# Patient Record
Sex: Male | Born: 1985 | Race: Black or African American | Hispanic: No | Marital: Single | Smoking: Current every day smoker
Health system: Southern US, Community
[De-identification: ages and names within clinical notes are randomized; demographics above are authoritative.]

## PROBLEM LIST (undated history)

## (undated) DIAGNOSIS — R569 Unspecified convulsions: Secondary | ICD-10-CM

## (undated) DIAGNOSIS — T8092XA Unspecified transfusion reaction, initial encounter: Secondary | ICD-10-CM

## (undated) DIAGNOSIS — K5190 Ulcerative colitis, unspecified, without complications: Secondary | ICD-10-CM

## (undated) DIAGNOSIS — D689 Coagulation defect, unspecified: Secondary | ICD-10-CM

## (undated) DIAGNOSIS — F32A Depression, unspecified: Secondary | ICD-10-CM

## (undated) DIAGNOSIS — G8929 Other chronic pain: Secondary | ICD-10-CM

## (undated) DIAGNOSIS — I639 Cerebral infarction, unspecified: Secondary | ICD-10-CM

## (undated) DIAGNOSIS — K91850 Pouchitis: Secondary | ICD-10-CM

## (undated) DIAGNOSIS — Z5189 Encounter for other specified aftercare: Secondary | ICD-10-CM

## (undated) HISTORY — DX: Unspecified transfusion reaction, initial encounter: T80.92XA

## (undated) HISTORY — PX: TOTAL COLECTOMY: SHX852

## (undated) HISTORY — DX: Encounter for other specified aftercare: Z51.89

## (undated) HISTORY — PX: SHOULDER SURGERY: SHX246

## (undated) HISTORY — DX: Depression, unspecified: F32.A

## (undated) HISTORY — DX: Coagulation defect, unspecified: D68.9

## (undated) HISTORY — DX: Other chronic pain: G89.29

## (undated) HISTORY — DX: Pouchitis: K91.850

---

## 2011-09-10 DIAGNOSIS — F17200 Nicotine dependence, unspecified, uncomplicated: Secondary | ICD-10-CM | POA: Insufficient documentation

## 2011-09-10 DIAGNOSIS — Z9049 Acquired absence of other specified parts of digestive tract: Secondary | ICD-10-CM | POA: Insufficient documentation

## 2013-06-21 DIAGNOSIS — Z8719 Personal history of other diseases of the digestive system: Secondary | ICD-10-CM | POA: Insufficient documentation

## 2013-06-21 DIAGNOSIS — Z8673 Personal history of transient ischemic attack (TIA), and cerebral infarction without residual deficits: Secondary | ICD-10-CM | POA: Insufficient documentation

## 2015-05-04 DIAGNOSIS — F1020 Alcohol dependence, uncomplicated: Secondary | ICD-10-CM | POA: Insufficient documentation

## 2020-06-07 DIAGNOSIS — F3340 Major depressive disorder, recurrent, in remission, unspecified: Secondary | ICD-10-CM | POA: Insufficient documentation

## 2020-06-07 DIAGNOSIS — K5190 Ulcerative colitis, unspecified, without complications: Secondary | ICD-10-CM | POA: Insufficient documentation

## 2020-06-07 DIAGNOSIS — L0591 Pilonidal cyst without abscess: Secondary | ICD-10-CM | POA: Insufficient documentation

## 2020-08-20 ENCOUNTER — Encounter (HOSPITAL_COMMUNITY): Payer: Self-pay

## 2020-08-20 ENCOUNTER — Ambulatory Visit (INDEPENDENT_AMBULATORY_CARE_PROVIDER_SITE_OTHER): Payer: Self-pay

## 2020-08-20 ENCOUNTER — Other Ambulatory Visit: Payer: Self-pay

## 2020-08-20 ENCOUNTER — Ambulatory Visit (HOSPITAL_COMMUNITY)
Admission: EM | Admit: 2020-08-20 | Discharge: 2020-08-20 | Disposition: A | Discharge status: Home / Self Care | Payer: Self-pay

## 2020-08-20 DIAGNOSIS — R197 Diarrhea, unspecified: Secondary | ICD-10-CM | POA: Insufficient documentation

## 2020-08-20 DIAGNOSIS — R109 Unspecified abdominal pain: Secondary | ICD-10-CM | POA: Insufficient documentation

## 2020-08-20 DIAGNOSIS — K921 Melena: Secondary | ICD-10-CM | POA: Insufficient documentation

## 2020-08-20 HISTORY — DX: Cerebral infarction, unspecified: I63.9

## 2020-08-20 HISTORY — DX: Ulcerative colitis, unspecified, without complications: K51.90

## 2020-08-20 HISTORY — DX: Unspecified convulsions: R56.9

## 2020-08-20 LAB — CBC WITH DIFFERENTIAL/PLATELET
Abs Immature Granulocytes: 0 10*3/uL (ref 0.00–0.07)
Basophils Absolute: 0 10*3/uL (ref 0.0–0.1)
Basophils Relative: 0 %
Eosinophils Absolute: 0.1 10*3/uL (ref 0.0–0.5)
Eosinophils Relative: 3 %
HCT: 43.7 % (ref 39.0–52.0)
Hemoglobin: 15.3 g/dL (ref 13.0–17.0)
Immature Granulocytes: 0 %
Lymphocytes Relative: 40 %
Lymphs Abs: 1.3 10*3/uL (ref 0.7–4.0)
MCH: 31.1 pg (ref 26.0–34.0)
MCHC: 35 g/dL (ref 30.0–36.0)
MCV: 88.8 fL (ref 80.0–100.0)
Monocytes Absolute: 0.2 10*3/uL (ref 0.1–1.0)
Monocytes Relative: 8 %
Neutro Abs: 1.6 10*3/uL — ABNORMAL LOW (ref 1.7–7.7)
Neutrophils Relative %: 49 %
Platelets: 241 10*3/uL (ref 150–400)
RBC: 4.92 MIL/uL (ref 4.22–5.81)
RDW: 12.2 % (ref 11.5–15.5)
WBC: 3.2 10*3/uL — ABNORMAL LOW (ref 4.0–10.5)
nRBC: 0 % (ref 0.0–0.2)

## 2020-08-20 LAB — COMPREHENSIVE METABOLIC PANEL
ALT: 27 U/L (ref 0–44)
AST: 31 U/L (ref 15–41)
Albumin: 4.5 g/dL (ref 3.5–5.0)
Alkaline Phosphatase: 83 U/L (ref 38–126)
Anion gap: 11 (ref 5–15)
BUN: 10 mg/dL (ref 6–20)
CO2: 25 mmol/L (ref 22–32)
Calcium: 9.6 mg/dL (ref 8.9–10.3)
Chloride: 102 mmol/L (ref 98–111)
Creatinine, Ser: 0.8 mg/dL (ref 0.61–1.24)
GFR, Estimated: >60 mL/min (ref >60)
Glucose, Bld: 94 mg/dL (ref 70–99)
Potassium: 4.2 mmol/L (ref 3.5–5.1)
Sodium: 138 mmol/L (ref 135–145)
Total Bilirubin: 0.5 mg/dL (ref 0.3–1.2)
Total Protein: 8.1 g/dL (ref 6.5–8.1)

## 2020-08-20 LAB — TSH: TSH: 1.738 u[IU]/mL (ref 0.350–4.500)

## 2020-08-20 NOTE — ED Triage Notes (Addendum)
Pt in with c/o N/V/D, loss of appetite and abdominal pain that has been going on for a few years. States that he has Ulcerative colitis and thinks he has developed Chron's. States that his sxs have gradually been getting worse   Also c/o night sweats and 25 lb weight loss within the last 3 months  Pt has been taking imodium prn

## 2020-08-20 NOTE — Discharge Instructions (Signed)
Your white cell count is a little low, and high means infection You dont have anemia The abdominal xray is normal The chemistry test is not back yet, but we can inform you it is abnormal. Sign up for mychart to see your results.  Make sure you call for GI follow up ASAP

## 2020-08-20 NOTE — ED Provider Notes (Signed)
[facility]    CSN: [id number] Arrival date & time: 08/20/20  1305      History   Chief Complaint Chief Complaint  Patient presents with  . Nausea  . Vomiting       . Diarrhea  . Abdominal Cramping    HPI Mark Waters is a 34 y.o. male who presents due to having N/V/D, loss of appetite and chronic abdominal pain since 2013. Has hx of ulcerative colitis since age 28 y/o and thinks he has developed chron's. His symptoms have been gradually getting worse where he has 8-9 BM's/day with spotting. Has lost about 25 lbs in the past 3 months.  Wakes up with nausea and gets worse in am and vomits. Has night sweats He Thinks he has an anal fissure Has cold intolerance or hot intolerance with sweating  Has swelling of anal area and has used preparation H, and now has a duvey which helps.    Past Medical History:  Diagnosis Date  . Seizures (HCC)   . Stroke (HCC)   . Ulcerative colitis (HCC)     There are no problems to display for this patient.   History reviewed. No pertinent surgical history.     Home Medications    Prior to Admission medications   Medication Sig Start Date End Date Taking? Authorizing Provider  escitalopram (LEXAPRO) 10 MG tablet Take by mouth. 04/10/20  Yes [provider]  levETIRAcetam (KEPPRA) 100 MG/ML SOLN Take by mouth.    [provider]    Family History History reviewed. No pertinent family history.  Social History Social History   Tobacco Use  . Smoking status: Former Games developer  . Smokeless tobacco: Never Used  Vaping Use  . Vaping Use: Every day  Substance Use Topics  . Alcohol use: Yes  . Drug use: Yes    Types: Marijuana     Allergies   Patient has no known allergies.   Review of Systems Review of Systems Wakes up with nausea and gets worse in am and vomits. Has night sweats Has anal fissure Has cold intolerance or hot intolerance with sweating  Has swelling of anal area. + for blood in  stool, + wt loss Physical Exam Triage Vital Signs ED Triage Vitals  Enc Vitals Group     BP 08/20/20 1452 (!) 142/98     Pulse Rate 08/20/20 1449 66     Resp 08/20/20 1449 20     Temp 08/20/20 1449 97.9 F (36.6 C)     Temp Source 08/20/20 1449 Oral     SpO2 08/20/20 1449 100 %     Weight --      Height --      Head Circumference --      Peak Flow --      Pain Score 08/20/20 1451 3     Pain Loc --      Pain Edu? --      Excl. in GC? --    No data found.  Updated Vital Signs BP (!) 142/98   Pulse 66   Temp 97.9 F (36.6 C) (Oral)   Resp 20   SpO2 100%   Visual Acuity Right Eye Distance:   Left Eye Distance:   Bilateral Distance:    Right Eye Near:   Left Eye Near:    Bilateral Near:     Physical Exam Constitutional:      General: He is not in acute distress.    Appearance: He  is not toxic-appearing.     Comments: Is slim  HENT:     Head: Normocephalic.     Right Ear: External ear normal.     Left Ear: External ear normal.  Eyes:     General: No scleral icterus.    Conjunctiva/sclera: Conjunctivae normal.  Cardiovascular:     Rate and Rhythm: Normal rate and regular rhythm.     Heart sounds: No murmur heard.   Pulmonary:     Effort: Pulmonary effort is normal.     Breath sounds: Normal breath sounds.  Abdominal:     General: Abdomen is flat. Bowel sounds are normal. There is no distension.     Palpations: Abdomen is soft. There is no mass.     Tenderness: There is abdominal tenderness. There is no guarding or rebound.     Hernia: No hernia is present.     Comments: Has well healed scar on lower abd, and mild tenderness on LLQ  Musculoskeletal:        General: Normal range of motion.     Cervical back: Neck supple.  Skin:    General: Skin is warm and dry.     Findings: No rash.  Neurological:     Mental Status: He is alert and oriented to person, place, and time.     Gait: Gait normal.  Psychiatric:        Mood and Affect: Mood normal.         Behavior: Behavior normal.        Thought Content: Thought content normal.        Judgment: Judgment normal.      UC Treatments / Results  Labs (all labs ordered are listed, but only abnormal results are displayed) Labs Reviewed  CBC WITH DIFFERENTIAL/PLATELET - Abnormal; Notable for the following components:      Result Value   WBC 3.2 (*)    Neutro Abs 1.6 (*)    All other components within normal limits  COMPREHENSIVE METABOLIC PANEL  TSH    EKG   Radiology DG Abd 2 Views  Result Date: 08/20/2020 CLINICAL DATA:  34 year old male with ulcerative colitis, prior colon resection. Bloody diarrhea, abdominal pain. EXAM: ABDOMEN - 2 VIEW COMPARISON:  None. FINDINGS: Upright and supine views. Negative visible lungs. No pneumoperitoneum. Paucity of bowel gas in the abdomen with no dilated loops evident. Relatively normal appearing pelvic, rectal bowel gas. Normal gastric air. Other abdominal and pelvic visceral contours appear normal. Minimal levoconvex lumbar scoliosis. No acute osseous abnormality identified. IMPRESSION: Negative. Electronically Signed   By: Odessa Fleming M.D.   On: 08/20/2020 16:05    Procedures Procedures (including critical care time)  Medications Ordered in UC Medications - No data to display  Initial Impression / Assessment and Plan / UC Course  I have reviewed the triage vital signs and the nursing notes. Recurrent and worsening abd pain and diarrhea.  CBC does not show infection or anemia, KUB is neg.  Pertinent labs & imaging results that were available during my care of the patient were reviewed by me and considered in my medical decision making (see chart for details). CMP was still pending when he left. We will inform him if abnormal. Needs to FU with GI. I educated him about SIBO and while awaiting for test results he read about it, he has heard about Leaky Gut and believes was tested for this in the past.  If he gets worse pain, bleeding then he needs  to go to ER  Final Clinical Impressions(s) / UC Diagnoses   Final diagnoses:  Abdominal cramping  Diarrhea, unspecified type  Blood in stool     Discharge Instructions     Your white cell count is a little low, and high means infection You dont have anemia The abdominal xray is normal The chemistry test is not back yet, but we can inform you it is abnormal. Sign up for mychart to see your results.  Make sure you call for GI follow up ASAP    ED Prescriptions    None     PDMP not reviewed this encounter.   Garey Ham, PA-C 08/20/20 1650

## 2020-10-03 ENCOUNTER — Ambulatory Visit: Payer: Self-pay | Admitting: Gastroenterology

## 2020-11-28 ENCOUNTER — Other Ambulatory Visit: Payer: Self-pay

## 2020-11-28 ENCOUNTER — Ambulatory Visit: Payer: Self-pay

## 2020-11-28 ENCOUNTER — Ambulatory Visit (INDEPENDENT_AMBULATORY_CARE_PROVIDER_SITE_OTHER): Payer: 59 | Admitting: Family Medicine

## 2020-11-28 VITALS — BP 150/108 | HR 97 | Ht 75.0 in | Wt 195.6 lb

## 2020-11-28 DIAGNOSIS — G40909 Epilepsy, unspecified, not intractable, without status epilepticus: Secondary | ICD-10-CM | POA: Insufficient documentation

## 2020-11-28 DIAGNOSIS — M25512 Pain in left shoulder: Secondary | ICD-10-CM

## 2020-11-28 DIAGNOSIS — G8929 Other chronic pain: Secondary | ICD-10-CM | POA: Diagnosis not present

## 2020-11-28 DIAGNOSIS — M542 Cervicalgia: Secondary | ICD-10-CM | POA: Diagnosis not present

## 2020-11-28 DIAGNOSIS — I639 Cerebral infarction, unspecified: Secondary | ICD-10-CM | POA: Insufficient documentation

## 2020-11-28 NOTE — Patient Instructions (Addendum)
Thank you for coming in today.  Let try PT again.  Recheck in 4 weeks.   Let me know how its going.
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
## 2020-12-15 ENCOUNTER — Emergency Department (HOSPITAL_COMMUNITY)
Admission: EM | Admit: 2020-12-15 | Discharge: 2020-12-16 | Disposition: A | Discharge status: Home / Self Care | Payer: 59 | Attending: Emergency Medicine | Admitting: Emergency Medicine

## 2020-12-15 ENCOUNTER — Other Ambulatory Visit: Payer: Self-pay

## 2020-12-15 ENCOUNTER — Encounter (HOSPITAL_COMMUNITY): Payer: Self-pay

## 2020-12-15 ENCOUNTER — Emergency Department (HOSPITAL_COMMUNITY): Payer: 59

## 2020-12-15 DIAGNOSIS — Z87891 Personal history of nicotine dependence: Secondary | ICD-10-CM | POA: Insufficient documentation

## 2020-12-15 DIAGNOSIS — Y92410 Unspecified street and highway as the place of occurrence of the external cause: Secondary | ICD-10-CM | POA: Insufficient documentation

## 2020-12-15 DIAGNOSIS — M542 Cervicalgia: Secondary | ICD-10-CM | POA: Insufficient documentation

## 2020-12-15 DIAGNOSIS — M79642 Pain in left hand: Secondary | ICD-10-CM | POA: Insufficient documentation

## 2020-12-15 DIAGNOSIS — R519 Headache, unspecified: Secondary | ICD-10-CM | POA: Insufficient documentation

## 2020-12-15 DIAGNOSIS — S60222A Contusion of left hand, initial encounter: Secondary | ICD-10-CM

## 2020-12-15 DIAGNOSIS — S40012A Contusion of left shoulder, initial encounter: Secondary | ICD-10-CM

## 2020-12-15 DIAGNOSIS — M25512 Pain in left shoulder: Secondary | ICD-10-CM | POA: Insufficient documentation

## 2020-12-15 MED ORDER — MORPHINE SULFATE (PF) 4 MG/ML IV SOLN
4.0000 mg | Freq: Once | INTRAVENOUS | Status: AC
  Administered 2020-12-16: 4 mg via INTRAVENOUS
  Filled 2020-12-15: qty 1

## 2020-12-15 MED ORDER — HYDROCODONE-ACETAMINOPHEN 5-325 MG PO TABS: 1.0000 | ORAL_TABLET | Freq: Once | ORAL | Status: DC
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
## 2020-12-15 NOTE — ED Notes (Signed)
Pt asking for something for pain advised would need to speak with provider reference same. Provider sent a message

## 2020-12-15 NOTE — ED Notes (Signed)
Now asking if pt can get up and walk to the bathroom to have a bm. Spoke with provider and was advised that yes he could

## 2020-12-15 NOTE — ED Notes (Signed)
Provider at bedside at this time

## 2020-12-16 ENCOUNTER — Emergency Department (HOSPITAL_COMMUNITY): Payer: 59

## 2020-12-16 NOTE — ED Notes (Signed)
Provider at bedside at this time

## 2020-12-16 NOTE — ED Notes (Signed)
Pt requesting something to drink advised would need to speak with provider. Provider sent a message at this time

## 2020-12-16 NOTE — ED Notes (Signed)
Remains in the bathroom at this time

## 2020-12-16 NOTE — Discharge Instructions (Signed)
Apply ice for 30 minutes at a time, 4 times a day.  Take acetaminophen as needed for pain.  You should be as active as pain will allow you to the.

## 2020-12-16 NOTE — ED Notes (Signed)
Returned from ct at this time ?

## 2020-12-16 NOTE — ED Notes (Signed)
Patient transported to CT 

## 2020-12-16 NOTE — ED Notes (Signed)
Pt ambulated to restroom at this time with assistance from brother

## 2020-12-17 ENCOUNTER — Ambulatory Visit (INDEPENDENT_AMBULATORY_CARE_PROVIDER_SITE_OTHER): Payer: 59 | Admitting: Physical Therapy

## 2020-12-17 ENCOUNTER — Other Ambulatory Visit: Payer: Self-pay

## 2020-12-17 DIAGNOSIS — M542 Cervicalgia: Secondary | ICD-10-CM | POA: Diagnosis not present

## 2020-12-17 DIAGNOSIS — M6281 Muscle weakness (generalized): Secondary | ICD-10-CM

## 2020-12-17 DIAGNOSIS — M25512 Pain in left shoulder: Secondary | ICD-10-CM | POA: Diagnosis not present

## 2020-12-17 DIAGNOSIS — R600 Localized edema: Secondary | ICD-10-CM

## 2020-12-17 NOTE — Patient Instructions (Signed)
Access Code: [email or web address] URL: [email or web address] Date: 12/17/2020 Prepared by: Ivery Quale  Exercises Seated Passive Cervical Retraction - 2 x daily - 6 x weekly - 2-3 sets - 10 reps Seated Upper Trapezius Stretch - 2 x daily - 6 x weekly - 1 sets - 2-3 reps - 30 sec hold Seated Scapular Retraction - 2 x daily - 6 x weekly - 2-3 sets - 10 reps - 5 sec hold Isometric Shoulder External Rotation at Wall - 2 x daily - 6 x weekly - 1 sets - 10-20 reps - 6 sec hold Isometric Shoulder Extension at Wall - 2 x daily - 6 x weekly - 3 sets - 10 reps Standing Isometric Shoulder Flexion with Doorway - Arm Bent - 2 x daily - 6 x weekly - 3 sets - 10 reps Standing Isometric Shoulder Abduction with Doorway - Arm Bent - 2 x daily - 6 x weekly - 3 sets - 10 reps Supine Shoulder Flexion Extension AAROM with Dowel - 2 x daily - 6 x weekly - 1-2 sets - 10 reps Supine Shoulder Abduction AAROM with Dowel - 2 x daily - 6 x weekly - 1-2 sets - 10 reps
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
## 2020-12-26 ENCOUNTER — Ambulatory Visit (INDEPENDENT_AMBULATORY_CARE_PROVIDER_SITE_OTHER): Payer: 59 | Admitting: Family Medicine

## 2020-12-26 ENCOUNTER — Encounter: Payer: Self-pay | Admitting: Family Medicine

## 2020-12-26 ENCOUNTER — Other Ambulatory Visit: Payer: Self-pay

## 2020-12-26 VITALS — BP 140/80 | HR 97 | Ht 75.0 in | Wt 187.0 lb

## 2020-12-26 DIAGNOSIS — M542 Cervicalgia: Secondary | ICD-10-CM

## 2020-12-26 DIAGNOSIS — M25512 Pain in left shoulder: Secondary | ICD-10-CM

## 2020-12-26 DIAGNOSIS — M5412 Radiculopathy, cervical region: Secondary | ICD-10-CM | POA: Diagnosis not present

## 2020-12-26 DIAGNOSIS — G8929 Other chronic pain: Secondary | ICD-10-CM

## 2020-12-26 NOTE — Patient Instructions (Signed)
Thank you for coming in today.  Continue PT.   Please call [facility] at [phone] to schedule your spine injection.   Recheck in about 1 month.   Epidural Steroid Injection Patient Information  Description: The epidural space surrounds the nerves as they exit the spinal cord.  In some patients, the nerves can be compressed and inflamed by a bulging disc or a tight spinal canal (spinal stenosis).  By injecting steroids into the epidural space, we can bring irritated nerves into direct contact with a potentially helpful medication.  These steroids act directly on the irritated nerves and can reduce swelling and inflammation which often leads to decreased pain.  Epidural steroids may be injected anywhere along the spine and from the neck to the low back depending upon the location of your pain.   After numbing the skin with local anesthetic (like Novocaine), a small needle is passed into the epidural space slowly.  You may experience a sensation of pressure while this is being done.  The entire block usually last less than 10 minutes.  Conditions which may be treated by epidural steroids:   Low back and leg pain  Neck and arm pain  Spinal stenosis  Post-laminectomy syndrome  Herpes zoster (shingles) pain  Pain from compression fractures  Preparation for the injection:  1. Do not eat any solid food or dairy products within 8 hours of your appointment.  2. You may drink clear liquids up to 3 hours before appointment.  Clear liquids include water, black coffee, juice or soda.  No milk or cream please. 3. You may take your regular medication, including pain medications, with a sip of water before your appointment  Diabetics should hold regular insulin (if taken separately) and take 1/2 normal NPH dos the morning of the procedure.  Carry some sugar containing items with you to your appointment. 4. A driver must accompany you and be prepared to drive you home after your  procedure.  5. Bring all your current medications with your. 6. An IV may be inserted and sedation may be given at the discretion of the physician.   7. A blood pressure cuff, EKG and other monitors will often be applied during the procedure.  Some patients may need to have extra oxygen administered for a short period. 8. You will be asked to provide medical information, including your allergies, prior to the procedure.  We must know immediately if you are taking blood thinners (like Coumadin/Warfarin)  Or if you are allergic to IV iodine contrast (dye). We must know if you could possible be pregnant.  Possible side-effects:  Bleeding from needle site  Infection (rare, may require surgery)  Nerve injury (rare)  Numbness & tingling (temporary)  Difficulty urinating (rare, temporary)  Spinal headache ( a headache worse with upright posture)  Light -headedness (temporary)  Pain at injection site (several days)  Decreased blood pressure (temporary)  Weakness in arm/leg (temporary)  Pressure sensation in back/neck (temporary)  Call if you experience:  Fever/chills associated with headache or increased back/neck pain.  Headache worsened by an upright position.  New onset weakness or numbness of an extremity below the injection site  Hives or difficulty breathing (go to the emergency room)  Inflammation or drainage at the infection site  Severe back/neck pain  Any new symptoms which are concerning to you  Please note:  Although the local anesthetic injected can often make your back or neck feel good for several hours after the injection, the pain  will likely return.  It takes 3-7 days for steroids to work in the epidural space.  You may not notice any pain relief for at least that one week.  If effective, we will often do a series of three injections spaced 3-6 weeks apart to maximally decrease your pain.  After the initial series, we generally will wait several months  before considering a repeat injection of the same type.
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
## 2020-12-28 ENCOUNTER — Encounter: Payer: Self-pay | Admitting: Rehabilitative and Restorative Service Providers"

## 2020-12-28 ENCOUNTER — Ambulatory Visit (INDEPENDENT_AMBULATORY_CARE_PROVIDER_SITE_OTHER): Payer: 59 | Admitting: Rehabilitative and Restorative Service Providers"

## 2020-12-28 ENCOUNTER — Other Ambulatory Visit: Payer: Self-pay

## 2020-12-28 DIAGNOSIS — M6281 Muscle weakness (generalized): Secondary | ICD-10-CM

## 2020-12-28 DIAGNOSIS — M542 Cervicalgia: Secondary | ICD-10-CM | POA: Diagnosis not present

## 2020-12-28 DIAGNOSIS — R600 Localized edema: Secondary | ICD-10-CM | POA: Diagnosis not present

## 2020-12-28 DIAGNOSIS — M25512 Pain in left shoulder: Secondary | ICD-10-CM
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
## 2020-12-31 ENCOUNTER — Encounter: Payer: 59 | Admitting: Physical Therapy

## 2021-01-02 ENCOUNTER — Other Ambulatory Visit: Payer: Self-pay

## 2021-01-02 ENCOUNTER — Encounter: Payer: Self-pay | Admitting: Physical Therapy

## 2021-01-02 ENCOUNTER — Ambulatory Visit (INDEPENDENT_AMBULATORY_CARE_PROVIDER_SITE_OTHER): Payer: 59 | Admitting: Physical Therapy

## 2021-01-02 DIAGNOSIS — R600 Localized edema: Secondary | ICD-10-CM | POA: Diagnosis not present

## 2021-01-02 DIAGNOSIS — M6281 Muscle weakness (generalized): Secondary | ICD-10-CM | POA: Diagnosis not present

## 2021-01-02 DIAGNOSIS — M542 Cervicalgia: Secondary | ICD-10-CM | POA: Diagnosis not present

## 2021-01-02 DIAGNOSIS — M25512 Pain in left shoulder: Secondary | ICD-10-CM
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
## 2021-01-03 ENCOUNTER — Other Ambulatory Visit: Payer: Self-pay | Admitting: Family Medicine

## 2021-01-03 ENCOUNTER — Ambulatory Visit
Admission: RE | Admit: 2021-01-03 | Discharge: 2021-01-03 | Disposition: A | Discharge status: Home / Self Care | Payer: 59 | Source: Ambulatory Visit | Attending: Family Medicine | Admitting: Family Medicine

## 2021-01-03 DIAGNOSIS — M5412 Radiculopathy, cervical region: Secondary | ICD-10-CM

## 2021-01-03 MED ORDER — TRIAMCINOLONE ACETONIDE 40 MG/ML IJ SUSP (RADIOLOGY)
60.0000 mg | Freq: Once | INTRAMUSCULAR | Status: AC
  Administered 2021-01-03: 60 mg via EPIDURAL

## 2021-01-03 MED ORDER — IOPAMIDOL (ISOVUE-M 300) INJECTION 61%
1.0000 mL | Freq: Once | INTRAMUSCULAR | Status: AC | PRN
  Administered 2021-01-03: 1 mL via EPIDURAL

## 2021-01-03 NOTE — Discharge Instructions (Signed)

## 2021-01-07 ENCOUNTER — Encounter: Payer: 59 | Admitting: Physical Therapy

## 2021-01-09 ENCOUNTER — Ambulatory Visit (INDEPENDENT_AMBULATORY_CARE_PROVIDER_SITE_OTHER): Payer: 59 | Admitting: Physical Therapy

## 2021-01-09 ENCOUNTER — Encounter: Payer: 59 | Admitting: Physical Therapy

## 2021-01-09 ENCOUNTER — Other Ambulatory Visit: Payer: Self-pay

## 2021-01-09 DIAGNOSIS — R600 Localized edema: Secondary | ICD-10-CM | POA: Diagnosis not present

## 2021-01-09 DIAGNOSIS — M542 Cervicalgia: Secondary | ICD-10-CM | POA: Diagnosis not present

## 2021-01-09 DIAGNOSIS — M6281 Muscle weakness (generalized): Secondary | ICD-10-CM | POA: Diagnosis not present

## 2021-01-09 DIAGNOSIS — M25512 Pain in left shoulder: Secondary | ICD-10-CM
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
## 2021-01-14 ENCOUNTER — Encounter: Payer: 59 | Admitting: Physical Therapy

## 2021-01-16 ENCOUNTER — Other Ambulatory Visit: Payer: Self-pay

## 2021-01-16 ENCOUNTER — Ambulatory Visit (INDEPENDENT_AMBULATORY_CARE_PROVIDER_SITE_OTHER): Payer: 59 | Admitting: Physical Therapy

## 2021-01-16 DIAGNOSIS — M25512 Pain in left shoulder: Secondary | ICD-10-CM

## 2021-01-16 DIAGNOSIS — R600 Localized edema: Secondary | ICD-10-CM | POA: Diagnosis not present

## 2021-01-16 DIAGNOSIS — M542 Cervicalgia: Secondary | ICD-10-CM

## 2021-01-16 DIAGNOSIS — M6281 Muscle weakness (generalized): Secondary | ICD-10-CM

## 2021-01-16 NOTE — Therapy (Addendum)
[facility] Physical Therapy [address] [place], [place], [address] Phone: [phone]   Fax:  [phone]  Physical Therapy Treatment Discharge   Patient Details  Name: Mark Waters MRN: [id number] Date of Birth: 1985-09-16 Referring Provider (PT): Georgina Snell, MD   Encounter Date: 01/16/2021   PT End of Session - 01/16/21 1057     Visit Number 5    Number of Visits 12    Date for PT Re-Evaluation 02/11/21    PT Start Time 1019    PT Stop Time 1100    PT Time Calculation (min) 41 min    Activity Tolerance Patient tolerated treatment well    Behavior During Therapy [facility] for tasks assessed/performed             Past Medical History:  Diagnosis Date   Seizures ([place])    Stroke ([place])    Ulcerative colitis ([place])     No past surgical history on file.  There were no vitals filed for this visit.   Subjective Assessment - 01/16/21 1051     Subjective He says overall the pain is much better in his Lt neck and shoulder, he does still have N/T down his left arm with 15-20 minutes of gripping activity or hard exercise. he would like to have DN again and traction as he feels this has helped    Pertinent History Operative report: June 28, 2019 procedures left shoulder arthroscopy with 360 degree Bankart repair and capsulorrhaphy    Diagnostic tests Diagnostic U.S impression of Lt shoulder "Subacromial bursitis". MRI cervical spine February 269[facility] orthopedics.  Impression significant for moderate to severe left neuroforaminal narrowing at C6-7. Evaluation of the left ulnar to FDI motor nerve showed prolonged distal latency 4.5 ms  Left median radial dig 1 comparison nerve showed abnormal peak latency difference of median radial 0.5 ms    Pain Onset More than a month ago              [facility] Adult PT Treatment/Exercise - 01/16/21 0001       Shoulder Exercises: Standing   Extension Both    Theraband Level (Shoulder Extension) Level 3 (Green)     Extension Limitations 25 reps    Row Both    Theraband Level (Shoulder Row) Level 3 (Green)    Row Limitations 25 reps      Shoulder Exercises: ROM/Strengthening   UBE (Upper Arm Bike) L2, 2.5 min fwd, 2.5 min retro      Traction   Type of Traction Cervical    Min (lbs) 16    Max (lbs) 22    Time 15 min pre set 6 step program      Manual Therapy   Manual therapy comments compression to Lt upper trap, Lt infraspinatus                      PT Short Term Goals - 01/02/21 1205       PT SHORT TERM GOAL #1   Title He will perform FOTO    Status Achieved      PT SHORT TERM GOAL #2   Title He will be I and compliant with initial HEP.    Status On-going               PT Long Term Goals - 12/17/20 1001       PT LONG TERM GOAL #1   Title Pt will improve FOTO to predicted outcome once completed.  Time 8    Period Weeks    Status New    Target Date 02/11/21      PT LONG TERM GOAL #2   Title Pt will be I and compliant with HEP and verbalize plan for continued exercise post PT discharge    Status New      PT LONG TERM GOAL #3   Title Pt will improve Lt shoulder strength to at least 5-/5 MMT to improve function    Status New      PT LONG TERM GOAL #4   Title Pt will reduce overall pain to less than 3/10 with reaching, driving, usual activity.    Status New                   Plan - 01/16/21 1100     Clinical Impression Statement provided cues to reduce shoulder shrug with resisted rows and shoulder extensions as these were causing some pain and tightness in his Lt shoulder, this resolved after changing technique. We then treated him with mechanical traction and DN to reduce pain and radiculopathy with good result. Continue POC    Personal Factors and Comorbidities Comorbidity 2    Comorbidities PMH: seizures, stroke,  left shoulder arthroscopy with 360 degree Bankart repair 06/2019    Examination-Activity Limitations Carry;Lift;Sleep;Reach  Overhead    Examination-Participation Restrictions Cleaning;Community Activity;Driving;Occupation;Laundry    Stability/Clinical Decision Making Evolving/Moderate complexity    Rehab Potential Good    PT Frequency 2x / week    PT Duration 6 weeks    PT Treatment/Interventions Cryotherapy;Electrical Stimulation;Iontophoresis 67m/ml Dexamethasone;Moist Heat;Traction;Ultrasound;Therapeutic activities;Therapeutic exercise;Neuromuscular re-education;Manual techniques;Passive range of motion;Dry needling;Joint Manipulations;Spinal Manipulations;Vasopneumatic Device;Taping    PT Next Visit Plan how was traction? Consider Dry needling again, progress scapular and arm strength, cervical mobility. lumbar stretching as needed    PT Home Exercise Plan Access Code: [email or web address]    Consulted and Agree with Plan of Care Patient             Patient will benefit from skilled therapeutic intervention in order to improve the following deficits and impairments:  Decreased activity tolerance,Decreased range of motion,Decreased strength,Increased edema,Increased muscle spasms,Increased fascial restricitons,Impaired UE functional use,Impaired flexibility,Pain  Visit Diagnosis: Left shoulder pain, unspecified chronicity  Cervicalgia  Muscle weakness (generalized)  Localized edema  PHYSICAL THERAPY DISCHARGE SUMMARY  Visits from Start of Care: 5 of 12  Current functional level related to goals / functional outcomes: See above   Remaining deficits: See above   Education / Equipment: HEP   Patient agrees to discharge. Patient goals were not met. Patient is being discharged due to not returning since the last visit.    Problem List Patient Active Problem List   Diagnosis Date Noted   [place]izure disorder ([place] 11/28/2020   CV[place] (cerebral vascular accident) ([place] 11/28/2020   Ulcerative colitis, unspecified, without complications ([place] 019/37/9024  Recurrent major depressive disorder, in remission ([place]  06/07/2020   Pilonidal cyst 06/07/2020   Personal history of transient ischemic attack (TIA), and cerebral infarction without residual deficits 06/21/2013   Personal history of other diseases of the digestive system 06/21/2013   Acquired absence of other specified parts of digestive tract 09/10/2011   Nicotine dependence, unspecified, uncomplicated 009/73/5329   BSilvestre Mesi5/07/2021, 11:02 AM  [facility] [address] [address]Phone: [phone]  Fax:  [phone] Name: Mark PechacekMRN: [id number] of Birth: 130-May-1987JKearney Hard PT, MPT 02/19/21 1:33 PM

## 2021-01-17 ENCOUNTER — Ambulatory Visit (HOSPITAL_COMMUNITY)
Admission: RE | Admit: 2021-01-17 | Discharge: 2021-01-17 | Disposition: A | Discharge status: Home / Self Care | Payer: 59 | Attending: Psychiatry | Admitting: Psychiatry

## 2021-01-17 DIAGNOSIS — Z560 Unemployment, unspecified: Secondary | ICD-10-CM | POA: Diagnosis not present

## 2021-01-17 DIAGNOSIS — F322 Major depressive disorder, single episode, severe without psychotic features: Secondary | ICD-10-CM | POA: Insufficient documentation

## 2021-01-17 DIAGNOSIS — Z600 Problems of adjustment to life-cycle transitions: Secondary | ICD-10-CM | POA: Insufficient documentation

## 2021-01-17 DIAGNOSIS — F32A Depression, unspecified: Secondary | ICD-10-CM | POA: Diagnosis present
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
## 2021-01-18 ENCOUNTER — Telehealth (HOSPITAL_COMMUNITY): Payer: Self-pay | Admitting: Psychiatry

## 2021-01-21 ENCOUNTER — Telehealth (HOSPITAL_COMMUNITY): Payer: Self-pay | Admitting: Psychiatry

## 2021-01-21 ENCOUNTER — Telehealth (HOSPITAL_COMMUNITY): Payer: Self-pay | Admitting: Licensed Clinical Social Worker

## 2021-01-21 NOTE — Telephone Encounter (Signed)
D:  Returned pt's call re: MH-IOP.  Pt left vm that he contacted his insurance company to verify his benefits and he is wanting to start.  A:  Placed call to pt to inquire about his substance use.  Pt admits to use of inhalants and ETOH up to three times a week.  Recommended CD-IOP to pt.  Karissa Brone's, LCSW, LCAS first opening isn't until 02-05-21; so cm provided pt with [facility]'s phone numbers for their Dual Dx or CD-IOP.  R:  Pt receptive.

## 2021-01-23 ENCOUNTER — Ambulatory Visit (INDEPENDENT_AMBULATORY_CARE_PROVIDER_SITE_OTHER): Payer: 59 | Admitting: Family Medicine

## 2021-01-23 ENCOUNTER — Ambulatory Visit: Payer: Self-pay

## 2021-01-23 ENCOUNTER — Other Ambulatory Visit: Payer: Self-pay

## 2021-01-23 VITALS — BP 146/98 | HR 70 | Ht 75.0 in | Wt 178.8 lb

## 2021-01-23 DIAGNOSIS — G8929 Other chronic pain: Secondary | ICD-10-CM

## 2021-01-23 DIAGNOSIS — M5412 Radiculopathy, cervical region: Secondary | ICD-10-CM

## 2021-01-23 DIAGNOSIS — M25512 Pain in left shoulder: Secondary | ICD-10-CM | POA: Diagnosis not present

## 2021-01-23 NOTE — Patient Instructions (Signed)
Thank you for coming in today.  Continue PT.   OK to repeat the epidural steroid injection in the future if needed. Let me know.   Recheck in 6 weeks.   If the shoulder continue to be very unstable then surgical consultation is more reasonable  .

## 2021-01-23 NOTE — Progress Notes (Signed)
   I, Philbert Riser, LAT, ATC acting as a scribe for [name], MD.  Esco Joslyn is a 35 y.o. male who presents to [facility] today for f/u cervical radiculitis and chronic L shoulder pain. Pt was last seen by Dr. Denyse Amass on 12/26/20 and was referred for an epidural steroid injection which was performed on 01/03/21. Pt was also advised to cont PT of which he's completed 5 visits. Today, pt reports he feels a good bit of improvement. Pt felt like the dry needling was helpful. Pt still c/o numbness/tingling. Pt reports he is able to lift, but will experience weakness. Pt also still c/o laxity in [facility] joint and experiencing "sublux."  Dx imaging: 12/16/20 C-spine & head CT             12/15/20 L hand, L shoulder, & chest XR  Pertinent review of systems: No fevers or chills  Relevant historical information: History depression and ulcerative colitis   Exam:  BP (!) 146/98 (BP Location: Right Arm, Patient Position: Sitting, Cuff Size: Normal)   Pulse 70   Ht 6\' 3"  (1.905 m)   Wt 178 lb 12.8 oz (81.1 kg)   SpO2 100%   BMI 22.35 kg/m  General: Well Developed, well nourished, and in no acute distress.   MSK: C-spine normal motion.  Some pain located at just superior to medial clavicle with neck and lateral flexion Upper extremity strength is intact. Left shoulder normal-appearing Palpable click with shoulder motion.   Diagnostic Limited MSK Ultrasound of: Left [place] joint No joint effusion.  Left S joint similar in appearance to right SI joint. Impression: Normal appearance left [place] joint    Assessment and Plan: 35 y.o. male with left shoulder pain due to history of dislocation and fracture.  Patient still has subluxation but is improving with physical therapy.  Plan to maximize conservative management with physical therapy and recheck in 6 weeks.  If still not better would recommend surgical consultation to discuss options at that point.  Cervical radiculopathy.   Significant improvement with 1 epidural steroid injection.  Can repeat injection if needed.  Again continue physical therapy and reassess in 6 weeks   PDMP not reviewed this encounter. Orders Placed This Encounter  Procedures  . 20 LIMITED JOINT SPACE STRUCTURES UP LEFT(NO LINKED CHARGES)    Standing Status:   Future    Number of Occurrences:   1    Standing Expiration Date:   07/26/2021    Order Specific Question:   Reason for Exam (SYMPTOM  OR DIAGNOSIS REQUIRED)    Answer:   left shoulder pain    Order Specific Question:   Preferred imaging location?    Answer:   [facility]   No orders of the defined types were placed in this encounter.    Discussed warning signs or symptoms. Please see discharge instructions. Patient expresses understanding.   The above documentation has been reviewed and is accurate and complete 07/28/2021, M.D.

## 2021-02-05 ENCOUNTER — Encounter: Payer: Self-pay | Admitting: Family Medicine

## 2021-02-05 ENCOUNTER — Ambulatory Visit (INDEPENDENT_AMBULATORY_CARE_PROVIDER_SITE_OTHER): Payer: 59 | Admitting: Licensed Clinical Social Worker

## 2021-02-05 ENCOUNTER — Other Ambulatory Visit: Payer: Self-pay

## 2021-02-05 DIAGNOSIS — F1010 Alcohol abuse, uncomplicated: Secondary | ICD-10-CM | POA: Diagnosis not present

## 2021-02-05 DIAGNOSIS — F339 Major depressive disorder, recurrent, unspecified: Secondary | ICD-10-CM | POA: Diagnosis not present

## 2021-02-05 DIAGNOSIS — F4310 Post-traumatic stress disorder, unspecified: Secondary | ICD-10-CM

## 2021-02-05 DIAGNOSIS — F1210 Cannabis abuse, uncomplicated: Secondary | ICD-10-CM
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
## 2021-02-11 ENCOUNTER — Telehealth: Payer: 59 | Admitting: Physician Assistant

## 2021-02-11 ENCOUNTER — Encounter: Payer: Self-pay | Admitting: Physician Assistant

## 2021-02-11 DIAGNOSIS — Z021 Encounter for pre-employment examination: Secondary | ICD-10-CM

## 2021-02-11 NOTE — Progress Notes (Signed)
Mark Waters are scheduled for a virtual visit with your provider today.    Just as we do with appointments in the office, we must obtain your consent to participate.  Your consent will be active for this visit and any virtual visit you may have with one of our providers in the next 365 days.    If you have a MyChart account, I can also send a copy of this consent to you electronically.  All virtual visits are billed to your insurance company just like a traditional visit in the office.  As this is a virtual visit, video technology does not allow for your provider to perform a traditional examination.  This may limit your provider's ability to fully assess your condition.  If your provider identifies any concerns that need to be evaluated in person or the need to arrange testing such as labs, EKG, etc, we will make arrangements to do so.    Although advances in technology are sophisticated, we cannot ensure that it will always work on either your end or our end.  If the connection with a video visit is poor, we may have to switch to a telephone visit.  With either a video or telephone visit, we are not always able to ensure that we have a secure connection.   I need to obtain your verbal consent now.   Are you willing to proceed with your visit today?   Mark Waters has provided verbal consent on 02/11/2021 for a virtual visit (video or telephone).   Mark Loveless, PA-C 02/11/2021  10:57 AM    MyChart Video Visit    Virtual Visit via Video Note   This visit type was conducted due to national recommendations for restrictions regarding the COVID-19 Pandemic (e.g. social distancing) in an effort to limit this patient's exposure and mitigate transmission in our community. This patient is at least at moderate risk for complications without adequate follow up. This format is felt to be most appropriate for this patient at this time. Physical exam was limited by quality of the video and audio  technology used for the visit.   Patient location: Walking outside Provider location: Home office in [place]  I discussed the limitations of evaluation and management by telemedicine and the availability of in person appointments. The patient expressed understanding and agreed to proceed.  Patient: Mark Waters   DOB: [birth year]   35 y.o. Male  MRN: [id number] Visit Date: 02/11/2021  Today's healthcare provider: Margaretann Loveless, PA-C   No chief complaint on file.  Subjective    HPI  Mark Waters is a 35 yr old male that presents today via [employer] for possible pre-employment examination. He does not have an established PCP. He is trying to get a position with [employer].  Patient Active Problem List   Diagnosis Date Noted  . Current severe episode of major depressive disorder without psychotic features without prior episode (HCC)   . Seizure disorder (HCC) 11/28/2020  . CVA (cerebral vascular accident) (HCC) 11/28/2020  . Ulcerative colitis, unspecified, without complications (HCC) 06/07/2020  . Recurrent major depressive disorder, in remission (HCC) 06/07/2020  . Pilonidal cyst 06/07/2020  . Personal history of transient ischemic attack (TIA), and cerebral infarction without residual deficits 06/21/2013  . Personal history of other diseases of the digestive system 06/21/2013  . Acquired absence of other specified parts of digestive tract 09/10/2011  . Nicotine dependence, unspecified, uncomplicated 09/10/2011   Past Medical History:  Diagnosis Date  .  Seizures (HCC)   . Stroke (HCC)   . Ulcerative colitis (HCC)        Medications: Outpatient Medications Prior to Visit  Medication Sig  . escitalopram (LEXAPRO) 10 MG tablet Take 10 mg by mouth daily.  Marland Kitchen levETIRAcetam (KEPPRA) 500 MG tablet Take 1,000 mg by mouth 2 (two) times daily.  Marland Kitchen loperamide (IMODIUM A-D) 2 MG tablet Take 2 mg by mouth 4 (four) times daily as needed for diarrhea or loose stools.    No facility-administered medications prior to visit.    Review of Systems  Constitutional: Negative.   Respiratory: Negative.   Cardiovascular: Negative.   Neurological: Negative.   Psychiatric/Behavioral: Negative.     Last CBC Lab Results  Component Value Date   WBC 3.2 (L) 08/20/2020   HGB 15.3 08/20/2020   HCT 43.7 08/20/2020   MCV 88.8 08/20/2020   MCH 31.1 08/20/2020   RDW 12.2 08/20/2020   PLT 241 08/20/2020   Last metabolic panel Lab Results  Component Value Date   GLUCOSE 94 08/20/2020   NA 138 08/20/2020   K 4.2 08/20/2020   CL 102 08/20/2020   CO2 25 08/20/2020   BUN 10 08/20/2020   CREATININE 0.80 08/20/2020   GFRNONAA >60 08/20/2020   CALCIUM 9.6 08/20/2020   PROT 8.1 08/20/2020   ALBUMIN 4.5 08/20/2020   BILITOT 0.5 08/20/2020   ALKPHOS 83 08/20/2020   AST 31 08/20/2020   ALT 27 08/20/2020   ANIONGAP 11 08/20/2020      Objective    There were no vitals taken for this visit. BP Readings from Last 3 Encounters:  01/23/21 (!) 146/98  01/03/21 122/81  12/26/20 140/80   Wt Readings from Last 3 Encounters:  01/23/21 178 lb 12.8 oz (81.1 kg)  12/26/20 187 lb (84.8 kg)  12/15/20 195 lb (88.5 kg)      Physical Exam Vitals reviewed.  Constitutional:      General: He is not in acute distress.    Appearance: Normal appearance. He is well-developed. He is not ill-appearing.  HENT:     Head: Normocephalic and atraumatic.  Eyes:     Conjunctiva/sclera: Conjunctivae normal.  Pulmonary:     Effort: Pulmonary effort is normal. No respiratory distress.  Musculoskeletal:     Cervical back: Normal range of motion and neck supple.  Neurological:     Mental Status: He is alert.  Psychiatric:        Mood and Affect: Mood normal.        Behavior: Behavior normal.        Thought Content: Thought content normal.        Judgment: Judgment normal.        Assessment & Plan     1. Pre-employment examination - Advised we could not complete this  request due to not being able to have a hands-on examination with vital signs - Since patient does not have a PCP, and new patient examinations are on a 3-6 month wait, I have advised him to schedule an appointment for a pre-employment physical with his local urgent care. He voiced understanding.  No follow-ups on file.     I discussed the assessment and treatment plan with the patient. The patient was provided an opportunity to ask questions and all were answered. The patient agreed with the plan and demonstrated an understanding of the instructions.   The patient was advised to call back or seek an in-person evaluation if the symptoms worsen or if the  condition fails to improve as anticipated.  I provided 8 minutes of face-to-face time during this encounter via MyChart Video enabled encounter.   Reine Just [facility] [phone] (phone) [phone] (fax)  [facility]

## 2021-03-06 ENCOUNTER — Ambulatory Visit: Payer: 59 | Admitting: Family Medicine

## 2021-03-06 NOTE — Progress Notes (Deleted)
    Mark Waters is a 35 y.o. male who presents to [facility] today for f/u of cervical radiculitis and chronic L shoulder pain.  He was last seen by Dr. Denyse Amass on 01/23/21 and noted improvement in his symptoms w/ PT of which he completed 5 visits.  He also had a cervical ESI (L C7-T1) on 01/03/21.  He was advised to con't PT but did not complete any additional visits.  Since his last visit w/ Dr. Denyse Amass, pt reports  Diagnostic imaging: C-spine CT- 12/16/20; L shoulder and chest XR- 12/15/20  Pertinent review of systems: ***  Relevant historical information: ***   Exam:  There were no vitals taken for this visit. General: Well Developed, well nourished, and in no acute distress.   MSK: ***    Lab and Radiology Results No results found for this or any previous visit (from the past 72 hour(s)). No results found.     Assessment and Plan: 35 y.o. male with ***   PDMP not reviewed this encounter. No orders of the defined types were placed in this encounter.  No orders of the defined types were placed in this encounter.    Discussed warning signs or symptoms. Please see discharge instructions. Patient expresses understanding.   ***

## 2021-04-11 IMAGING — DX DG ABDOMEN 2V
2 series · 2 of 2 positions shown · non-contrast
Comparison: None.

CLINICAL DATA: 33-year-old male with ulcerative colitis, prior
colon resection. Bloody diarrhea, abdominal pain.

EXAM:
ABDOMEN - 2 VIEW

[abdomen erect]
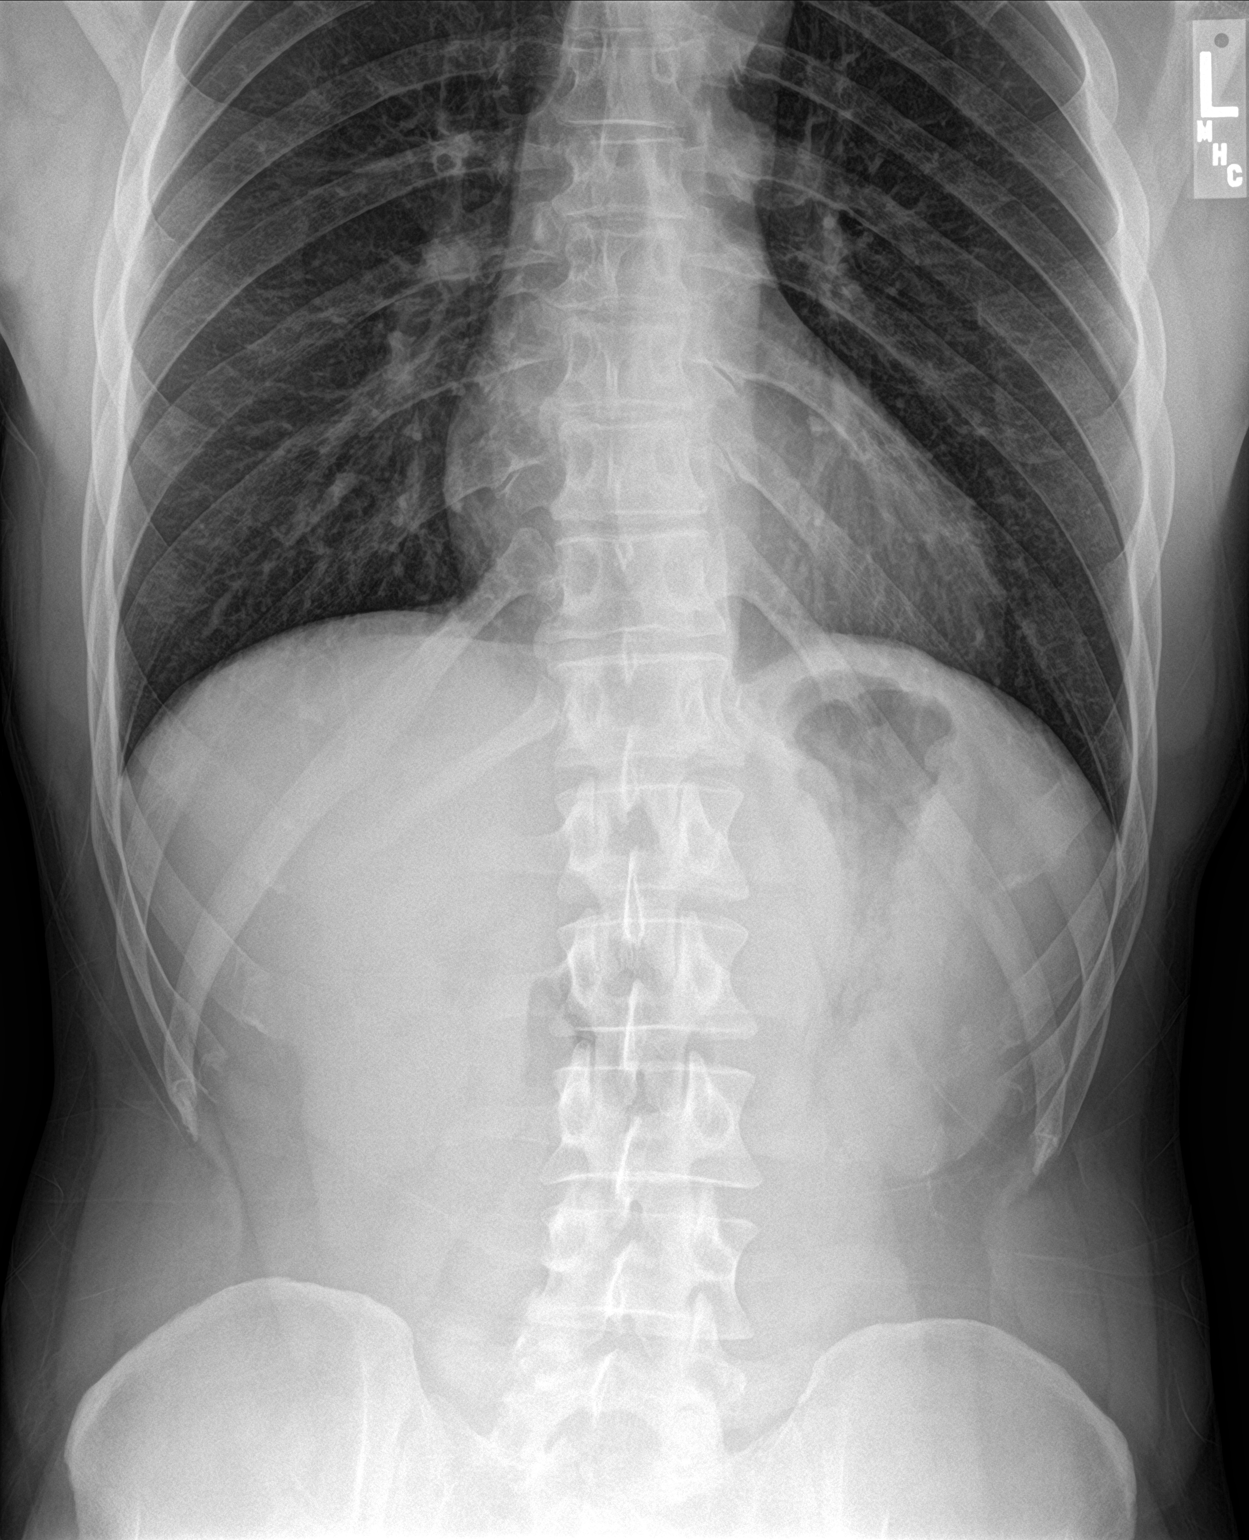

[abdomen supine]
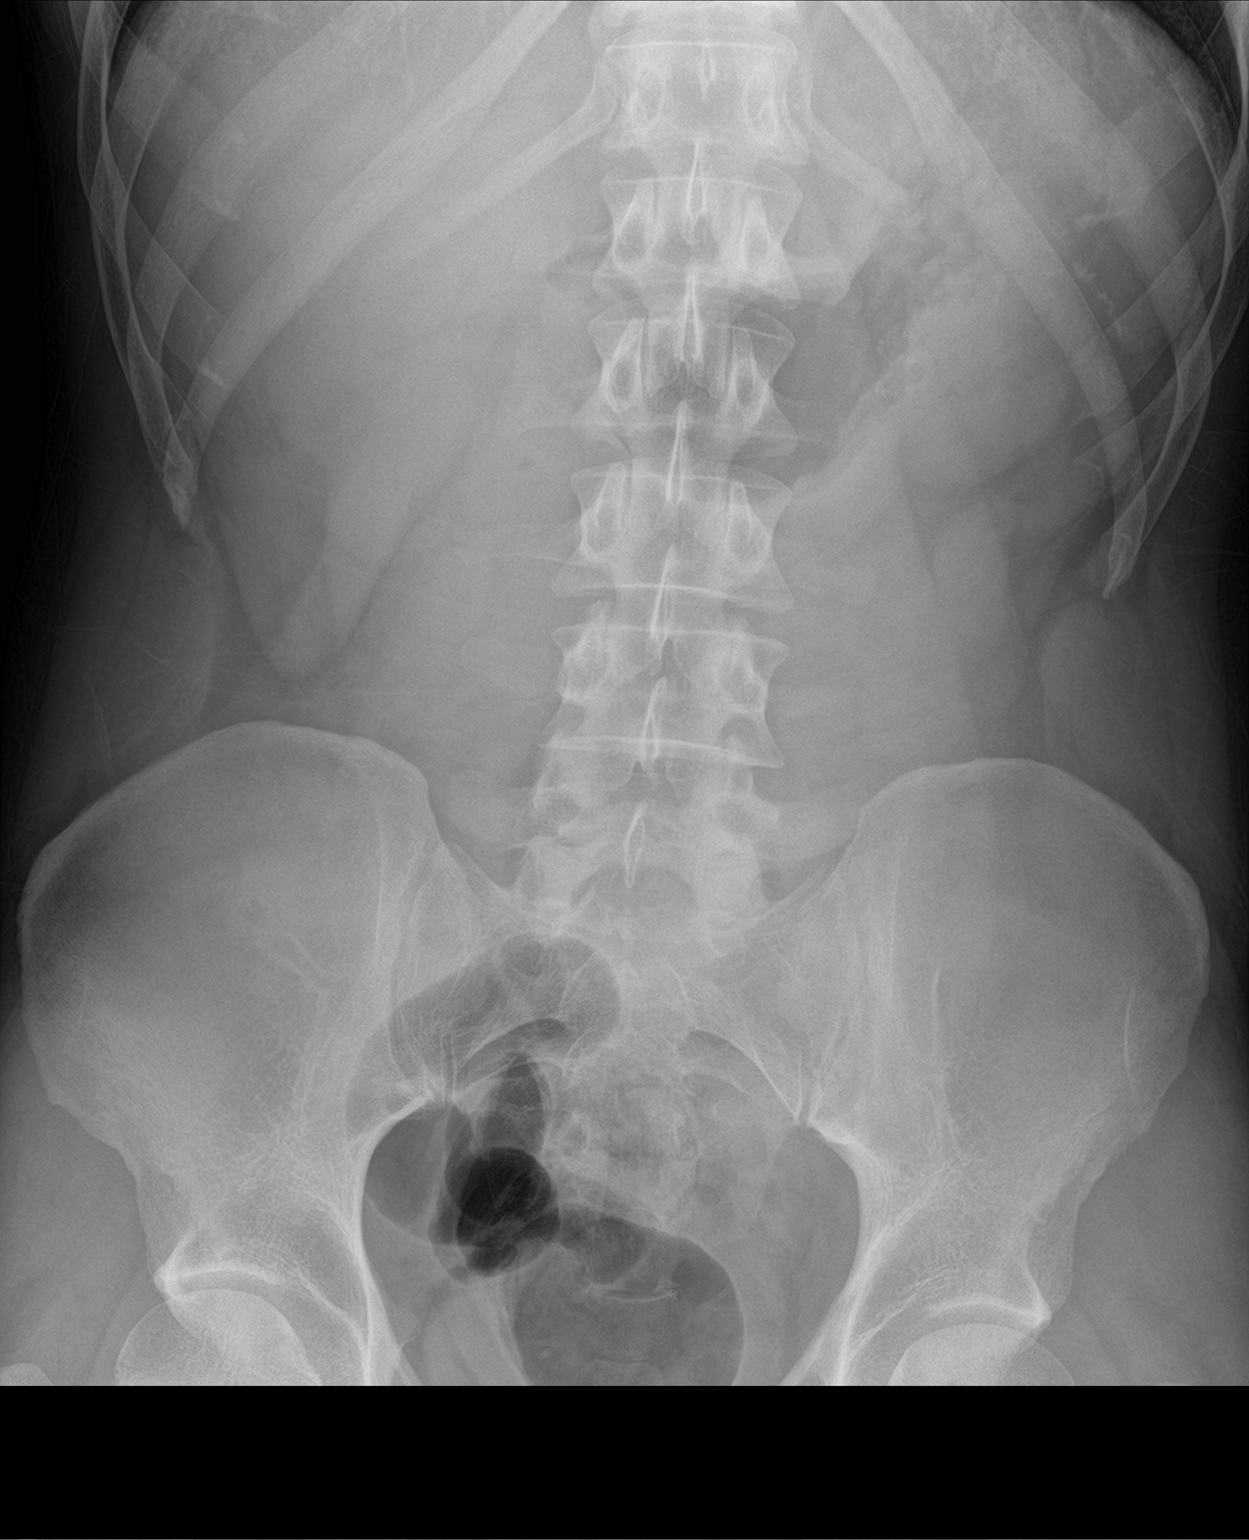

[2 of 2 positions shown; findings below may reference images not displayed]

FINDINGS: Upright and supine views. Negative visible lungs. No
pneumoperitoneum.

Paucity of bowel gas in the abdomen with no dilated loops evident.
Relatively normal appearing pelvic, rectal bowel gas. Normal gastric
air.

Other abdominal and pelvic visceral contours appear normal. Minimal
levoconvex lumbar scoliosis. No acute osseous abnormality
identified.
IMPRESSION: Negative.

## 2021-08-25 IMAGING — XA DG INJECT/[PERSON_NAME] INC NEEDLE/CATH/PLC EPI/CERV/THOR W/IMG
2 series · 2 of 2 positions shown · non-contrast
Comparison: none

CLINICAL DATA: Cervical spondylosis without myelopathy. Left C7
radiculopathy.

[Series 1: ortho standard · 1 of 1 slices shown (1 of 2)]
[im 1/1]
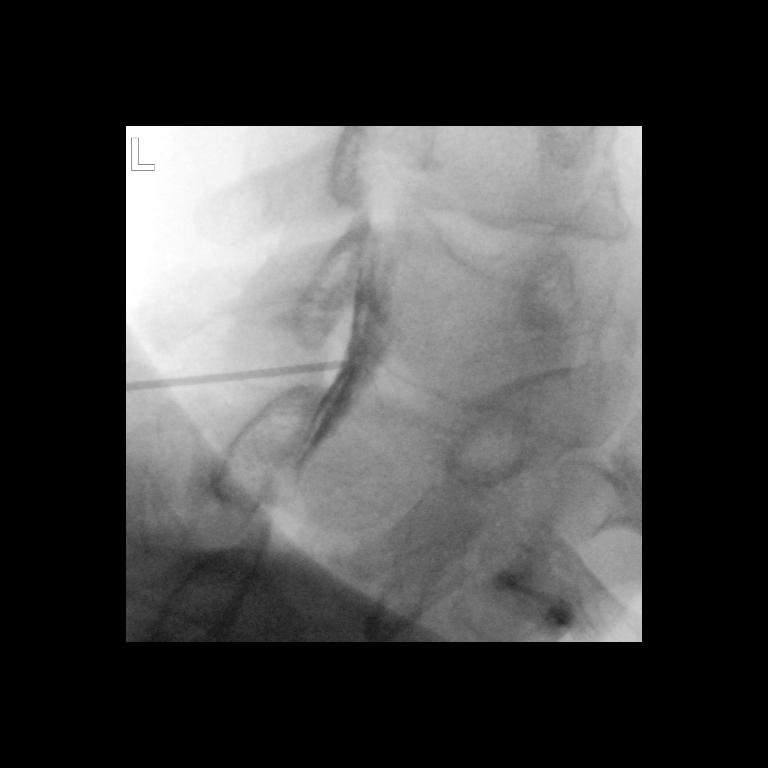

[Series 2: ortho standard · 1 of 1 slices shown (2 of 2)]
[im 1/1]
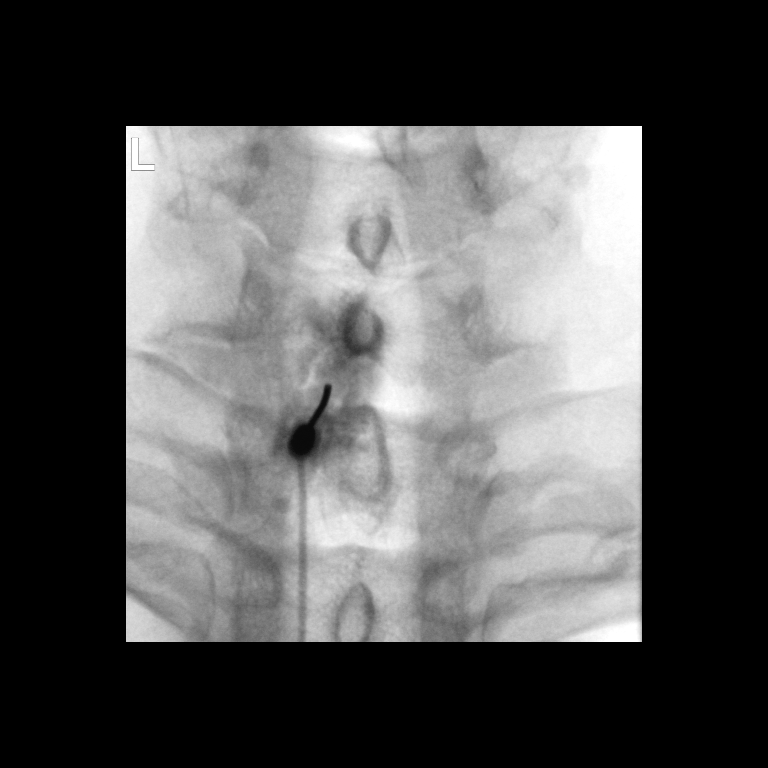

[2 of 2 positions shown; findings below may reference images not displayed]

FLUOROSCOPY TIME:  Fluoroscopy Time: 17 seconds

Radiation Exposure Index: 8.28 microGray*m^2

PROCEDURE:
The procedure, risks, benefits, and alternatives were explained to
the patient. Questions regarding the procedure were encouraged and
answered. The patient understands and consents to the procedure.

CERVICAL EPIDURAL INJECTION

An interlaminar approach was performed on the left at C7-T1. A
inch 20 gauge epidural needle was advanced using loss-of-resistance
technique.

DIAGNOSTIC EPIDURAL INJECTION

Injection of Isovue-M 300 shows a good epidural pattern with spread
above and below the level of needle placement, primarily on the
left. No vascular opacification is seen.

THERAPEUTIC EPIDURAL INJECTION

1.5 ml of Kenalog 40 mixed with 2 ml of normal saline were then
instilled. The procedure was well-tolerated, and the patient was
discharged thirty minutes following the injection in good condition.
IMPRESSION: Technically successful interlaminar epidural injection on the left
at C7-T1.

## 2021-12-10 ENCOUNTER — Emergency Department (HOSPITAL_COMMUNITY): Payer: PRIVATE HEALTH INSURANCE

## 2021-12-10 ENCOUNTER — Encounter (HOSPITAL_COMMUNITY): Payer: Self-pay

## 2021-12-10 ENCOUNTER — Other Ambulatory Visit: Payer: Self-pay

## 2021-12-10 ENCOUNTER — Emergency Department (HOSPITAL_COMMUNITY)
Admission: EM | Admit: 2021-12-10 | Discharge: 2021-12-10 | Disposition: A | Discharge status: Home / Self Care | Payer: PRIVATE HEALTH INSURANCE | Attending: Emergency Medicine | Admitting: Emergency Medicine

## 2021-12-10 DIAGNOSIS — S39012A Strain of muscle, fascia and tendon of lower back, initial encounter: Secondary | ICD-10-CM | POA: Diagnosis not present

## 2021-12-10 DIAGNOSIS — Y990 Civilian activity done for income or pay: Secondary | ICD-10-CM | POA: Diagnosis not present

## 2021-12-10 DIAGNOSIS — S3992XA Unspecified injury of lower back, initial encounter: Secondary | ICD-10-CM | POA: Diagnosis present

## 2021-12-10 DIAGNOSIS — X500XXA Overexertion from strenuous movement or load, initial encounter: Secondary | ICD-10-CM | POA: Insufficient documentation

## 2021-12-10 MED ORDER — OXYCODONE-ACETAMINOPHEN 5-325 MG PO TABS: 1.0000 | ORAL_TABLET | Freq: Three times a day (TID) | ORAL | 0 refills | Status: DC | PRN

## 2021-12-10 MED ORDER — CYCLOBENZAPRINE HCL 10 MG PO TABS
5.0000 mg | ORAL_TABLET | Freq: Once | ORAL | Status: AC
  Administered 2021-12-10: 5 mg via ORAL
  Filled 2021-12-10: qty 1

## 2021-12-10 MED ORDER — METHOCARBAMOL 500 MG PO TABS
500.0000 mg | ORAL_TABLET | Freq: Once | ORAL | Status: AC
  Administered 2021-12-10: 500 mg via ORAL
  Filled 2021-12-10: qty 1

## 2021-12-10 MED ORDER — HYDROMORPHONE HCL 1 MG/ML IJ SOLN
0.5000 mg | Freq: Once | INTRAMUSCULAR | Status: AC
  Administered 2021-12-10: 0.5 mg via INTRAMUSCULAR
  Filled 2021-12-10: qty 1

## 2021-12-10 MED ORDER — METHOCARBAMOL 500 MG PO TABS: 500.0000 mg | ORAL_TABLET | Freq: Three times a day (TID) | ORAL | 0 refills | Status: DC | PRN

## 2021-12-10 MED ORDER — PREDNISONE 20 MG PO TABS: 40.0000 mg | ORAL_TABLET | Freq: Every day | ORAL | 0 refills | Status: DC

## 2021-12-10 NOTE — ED Provider Notes (Signed)
?[facility] EMERGENCY DEPARTMENT ?Provider Note ? ? ?CSN: [id number] ?Arrival date & time: 12/10/21  1330 ? ?  ? ?History ? ?Chief Complaint  ?Patient presents with  ? Back Pain  ? ? ?Mark Waters is a 36 y.o. male. ? ? ?Back Pain ?Associated symptoms: no abdominal pain, no chest pain, no numbness and no weakness   ?Patient presents with low back pain.  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  Severe pain.  Worse with movement.  Difficulty sitting.  No loss of bladder bowel control.  Not have episodes like this before.  Does have a history of colectomy due to ulcerative colitis.  States he cannot take NSAIDs because of this.  States he feels spasms in his low back. ?  ? ?Home Medications ?Prior to Admission medications   ?Medication Sig Start Date End Date Taking? Authorizing Provider  ?methocarbamol (ROBAXIN) 500 MG tablet Take 1 tablet (500 mg total) by mouth every 8 (eight) hours as needed for muscle spasms. 12/10/21  Yes Benjiman Core, MD  ?oxyCODONE-acetaminophen (PERCOCET/ROXICET) 5-325 MG tablet Take 1-2 tablets by mouth every 8 (eight) hours as needed for severe pain. 12/10/21  Yes Benjiman Core, MD  ?predniSONE (DELTASONE) 20 MG tablet Take 2 tablets (40 mg total) by mouth daily. 12/10/21  Yes Benjiman Core, MD  ?escitalopram (LEXAPRO) 10 MG tablet Take 10 mg by mouth daily. 04/10/20   [provider]  ?levETIRAcetam (KEPPRA) 500 MG tablet Take 1,000 mg by mouth 2 (two) times daily. 12/06/20   [provider]  ?loperamide (IMODIUM A-D) 2 MG tablet Take 2 mg by mouth 4 (four) times daily as needed for diarrhea or loose stools.    [provider]  ?   ?Past Medical History:  ?Diagnosis Date  ? Seizures (HCC)   ? Stroke [facility])   ? Ulcerative colitis (HCC)   ? ?Past Surgical History:  ?Procedure Laterality Date  ? SHOULDER SURGERY Left   ? 3 rods in left shoulder  ? ? ?Allergies    ?Patient has no known allergies.   ? ?Review  of Systems   ?Review of Systems  ?Constitutional:  Negative for appetite change.  ?Cardiovascular:  Negative for chest pain.  ?Gastrointestinal:  Negative for abdominal pain.  ?Musculoskeletal:  Positive for back pain.  ?Neurological:  Negative for weakness and numbness.  ? ?Physical Exam ?Updated Vital Signs ?BP (!) 151/116 (BP Location: Left Arm)   Pulse 82   Temp 97.8 ?F (36.6 ?C) (Oral)   Resp (!) 22   Ht 6\' 3"  (1.905 m)   Wt 88.5 kg   SpO2 100%   BMI 24.37 kg/m?  ?Physical Exam ?Vitals and nursing note reviewed.  ?Constitutional:   ?   Appearance: Normal appearance.  ?   Comments: Patient appears uncomfortable.  ?Cardiovascular:  ?   Rate and Rhythm: Regular rhythm.  ?Pulmonary:  ?   Breath sounds: No wheezing or rhonchi.  ?Abdominal:  ?   Tenderness: There is no abdominal tenderness.  ?Musculoskeletal:     ?   General: Tenderness present.  ?   Cervical back: Neck supple.  ?   Comments: Tenderness to left lumbar and paraspinal area.  No rash.  Pain with straight leg raise on left.  ? ? ?ED Results / Procedures / Treatments   ?Labs ?(all labs ordered are listed, but only abnormal results are displayed) ?Labs Reviewed - No data to display ? ?  EKG ?None ? ?Radiology ?DG Lumbar Spine Complete ? ?Result Date: 12/10/2021 ?CLINICAL DATA:  Back injury, lifting. EXAM: LUMBAR SPINE - COMPLETE 4+ VIEW COMPARISON:  None. FINDINGS: There is no evidence of lumbar spine fracture. Alignment is normal. Intervertebral disc spaces are maintained. IMPRESSION: Negative. Electronically Signed   By: Darliss Cheney M.D.   On: 12/10/2021 16:44   ? ?Procedures ?Procedures  ? ? ?Medications Ordered in ED ?Medications  ?cyclobenzaprine (FLEXERIL) tablet 5 mg (5 mg Oral Given 12/10/21 1604)  ?HYDROmorphone (DILAUDID) injection 0.5 mg (0.5 mg Intramuscular Given 12/10/21 1727)  ?methocarbamol (ROBAXIN) tablet 500 mg (500 mg Oral Given 12/10/21 1727)  ? ? ?ED Course/ Medical Decision Making/ A&P ?  ?                        ?Medical Decision  Making ?Risk ?Prescription drug management. ? ? ?Patient presents with low back pain.  Began acutely while lifting a heavy grill.Severe pain in the low back.  X-ray done and independently interpreted and reassuring.  No fracture.  Given shot of pain medicine here.  Some improvement in pain.  Had also had Flexeril without relief.  Also given Robaxin here.  No red flags.  Do not think we need further imaging at this time.  Will need neurosurgery follow-up if symptoms do not improve.  We will treat symptomatically at home.  Cannot do Toradol but will do steroids and pain medicines.  Appears stable for discharge ? ? ? ? ? ? ? ?Final Clinical Impression(s) / ED Diagnoses ?Final diagnoses:  ?Lumbosacral strain, initial encounter  ? ? ?Rx / DC Orders ?ED Discharge Orders   ? ?      Ordered  ?  methocarbamol (ROBAXIN) 500 MG tablet  Every 8 hours PRN       ? 12/10/21 1813  ?  predniSONE (DELTASONE) 20 MG tablet  Daily       ? 12/10/21 1813  ?  oxyCODONE-acetaminophen (PERCOCET/ROXICET) 5-325 MG tablet  Every 8 hours PRN       ? 12/10/21 1813  ? ?  ?  ? ?  ? ? ?  ?Benjiman Core, MD ?12/10/21 1813 ? ?

## 2021-12-10 NOTE — ED Notes (Signed)
Pt teaching provided on medications that may cause drowsiness. Pt instructed not to drive or operate heavy machinery while taking the prescribed medication. Pt verbalized understanding.  ? ?Pt provided discharge instructions and prescription information. Pt was given the opportunity to ask questions and questions were answered. Discharge signature not obtained in the setting of the COVID-19 pandemic in order to reduce high touch surfaces.  ? ?

## 2021-12-10 NOTE — ED Notes (Signed)
Patient transported to X-ray 

## 2021-12-10 NOTE — ED Provider Triage Note (Signed)
Emergency Medicine Provider Triage Evaluation Note ? ?Mark Waters , a 35 y.o. male  was evaluated in triage.  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  Patient states that as he was halfway up, he felt a sharp pop in his lower back and has been in severe pain ever since.  He denies saddle paresthesia, bladder and bowel dysfunction.  He does endorse some numbness and tingling down into his left toe.  He has a history of ulcerative colitis and declines Motrin and notes that Tylenol does not usually work for him.   ? ?I visualized patient walking to the bathroom prior to my evaluation.  He was in significant pain but ambulatory with the assistance of a walker. ?Review of Systems  ?Positive: As above ?Negative:  ? ?Physical Exam  ?BP (!) 151/116 (BP Location: Left Arm)   Pulse 82   Temp 97.8 ?F (36.6 ?C) (Oral)   Resp (!) 22   Ht 6\' 3"  (1.905 m)   Wt 88.5 kg   SpO2 100%   BMI 24.37 kg/m?  ?Gen:   Awake, no distress ; uncomfortable, ?Resp:  Normal effort  ?MSK:   Moves extremities without difficulty; no midline tenderness of the C-spine, T-spine or L-spine.  Positive left-sided paraspinal L-spine tenderness ?Other:   ? ?Medical Decision Making  ?Medically screening exam initiated at 3:58 PM.  Appropriate orders placed.  Mark Waters was informed that the remainder of the evaluation will be completed by another provider, this initial triage assessment does not replace that evaluation, and the importance of remaining in the ED until their evaluation is complete. ? ? ?  ?Mark Parcel, PA-C ?12/10/21 1600 ? ?

## 2021-12-10 NOTE — ED Triage Notes (Signed)
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 Pain with movement and ambulation. ?

## 2022-01-01 ENCOUNTER — Encounter (HOSPITAL_BASED_OUTPATIENT_CLINIC_OR_DEPARTMENT_OTHER): Payer: Self-pay

## 2022-01-08 ENCOUNTER — Encounter: Payer: Self-pay | Admitting: Orthopaedic Surgery

## 2022-01-08 ENCOUNTER — Ambulatory Visit (INDEPENDENT_AMBULATORY_CARE_PROVIDER_SITE_OTHER): Payer: PRIVATE HEALTH INSURANCE | Admitting: Orthopaedic Surgery

## 2022-01-08 VITALS — BP 137/95 | HR 75 | Ht 75.0 in | Wt 188.0 lb

## 2022-01-08 DIAGNOSIS — M545 Low back pain, unspecified: Secondary | ICD-10-CM

## 2022-01-08 MED ORDER — METHOCARBAMOL 500 MG PO TABS: 500.0000 mg | ORAL_TABLET | Freq: Three times a day (TID) | ORAL | 0 refills | Status: DC | PRN

## 2022-01-08 MED ORDER — GABAPENTIN 100 MG PO CAPS: ORAL_CAPSULE | ORAL | 1 refills | Status: DC

## 2022-01-08 NOTE — Progress Notes (Signed)
? ?Office Visit Note ?  ?Patient: Mark Waters           ?Date of Birth: July 18, 1986           ?MRN: [id number] ?Visit Date: 01/08/2022 ?             ?Requested by: No referring provider defined for this encounter. ?PCP: Patient, No Pcp Per (Inactive) ? ? ?Assessment & Plan: ?Visit Diagnoses:  ?1. Acute left-sided low back pain, unspecified whether sciatica present   ? ? ?Plan: MRI scan is reviewed with patient via disc he brought.  We discussed therapy might be an option but with his nerve root tension signs and disc protrusion with foraminal narrowing I recommend proceeding with a single epidural injection on the left at the L3-4 level.  Work note given continued modified work 4 hours/day until I see him back in the office post epidural injection. ? ?Follow-Up Instructions: Return in about 4 weeks (around 02/05/2022).  ? ?Orders:  ?Orders Placed This Encounter  ?Procedures  ? Ambulatory referral to Physical Medicine Rehab  ? ?Meds ordered this encounter  ?Medications  ? methocarbamol (ROBAXIN) 500 MG tablet  ?  Sig: Take 1 tablet (500 mg total) by mouth every 8 (eight) hours as needed for muscle spasms.  ?  Dispense:  50 tablet  ?  Refill:  0  ?  Workers comp injury  ? gabapentin (NEURONTIN) 100 MG capsule  ?  Sig: Take one at night for 5 days then 2 po q HS  ?  Dispense:  60 capsule  ?  Refill:  1  ?  Workers comp injury  ? ? ? ? Procedures: ?No procedures performed ? ? ?Clinical Data: ?No additional findings. ? ? ?Subjective: ?Chief Complaint  ?Patient presents with  ? Lower Back - Pain  ?  OTJI 12/10/2021  ? ? 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  When he is lifting and squatting he felt a pop and sharp pain in his back excruciating radiating into his left buttocks into his left thigh most the time stopping near his knee sometimes down to the  ankle.  He has felt posterior thigh tightness and has some difficulty walking.  He normally walks in the bus and also walks at home.  He states the thigh feels like it will tighten up like a charley horse.  He states his walking is gotten slightly better but but he still has back pain left buttocks pain left thigh pain.  Some difficulty sleeping.  He has been restricted and is only working 4 hours/day currently.  MRI scan was obtained at [facility] which showed a left L3-4 foraminal disc protrusion with foraminal stenosis.  Some of the disc was foraminal and some extraforaminal.  No central stenosis was present.  Patient's been treated with prednisone Dosepak, Robaxin, Percocet, Lexapro and Keppra.  Patient has not been on gabapentin.  Patient has been treated at [facility] and those notes are reviewed today with him.  Patient brought a disc of his MRI and this is reviewed images and report with patient as well. ? 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 ? ?Review of Systems all other systems are noncontributory.  Patient has history of ulcerative colitis with colostomy cannot take anti-inflammatories.  All the systems  are noncontributory to HPI. ? ? ?Objective: ?Vital Signs: BP (!) 137/95   Pulse 75   Ht 6\' 3"  (1.905 m)   Wt 188 lb (85.3 kg)   BMI 23.50 kg/m?  ? ?Physical Exam ?Constitutional:   ?   Appearance: He is well-developed.  ?HENT:  ?   Head: Normocephalic and atraumatic.  ?   Right Ear: External ear normal.  ?   Left Ear: External ear normal.  ?Eyes:  ?   Pupils: Pupils are equal, round, and reactive to light.  ?Neck:  ?   Thyroid: No thyromegaly.  ?   Trachea: No tracheal deviation.  ?Cardiovascular:  ?   Rate and Rhythm: Normal rate.  ?Pulmonary:  ?   Effort: Pulmonary effort is normal.  ?   Breath sounds: No wheezing.  ?Abdominal:  ?   General: Bowel sounds are normal.  ?   Palpations: Abdomen is soft.  ?Musculoskeletal:  ?   Cervical back: Neck supple.  ?Skin: ?    General: Skin is warm and dry.  ?   Capillary Refill: Capillary refill takes less than 2 seconds.  ?Neurological:  ?   Mental Status: He is alert and oriented to person, place, and time.  ?Psychiatric:     ?   Behavior: Behavior normal.     ?   Thought Content: Thought content normal.     ?   Judgment: Judgment normal.  ? ? ?Ortho Exam patient has positive sciatic notch tenderness and pain with straight leg raising on the left negative on the right.  He does have strong hip flexion quads are strong abductors strong.  Pain with resisted hip flexion and straight leg raise testing.  Anterior tib gastrocsoleus peroneals are all normal. ? ?Specialty Comments:  ?No specialty comments available. ? ?Imaging: ?MRI done at the [facility] scanner shows moderate broad based left foraminal extraforaminal L3-4 disc protrusion superimposed upon moderate left lateral bulging disc with moderate severe left neuroforaminal narrowing, impinging upon the dorsal lateral displacement of exiting left L3 nerve root.  Mild bulging at L4-5 and L5-S1 without narrowing noted.  Read by Dr.Tansavatdi    12/31/21 ? ? ?PMFS History: ?Patient Active Problem List  ? Diagnosis Date Noted  ? Current severe episode of major depressive disorder without psychotic features without prior episode (HCC)   ? Seizure disorder (HCC) 11/28/2020  ? CVA (cerebral vascular accident) (HCC) 11/28/2020  ? Ulcerative colitis, unspecified, without complications (HCC) 06/07/2020  ? Recurrent major depressive disorder, in remission (HCC) 06/07/2020  ? Pilonidal cyst 06/07/2020  ? Personal history of transient ischemic attack (TIA), and cerebral infarction without residual deficits 06/21/2013  ? Personal history of other diseases of the digestive system 06/21/2013  ? Acquired absence of other specified parts of digestive tract 09/10/2011  ? Nicotine dependence, unspecified, uncomplicated 09/10/2011  ? ?Past Medical History:  ?Diagnosis Date  ? Seizures (HCC)   ? Stroke  [facility](HCC)   ? Ulcerative colitis (HCC)   ?  ?No family history on file.  ?Past Surgical History:  ?Procedure Laterality Date  ? SHOULDER SURGERY Left   ? 3 rods in left shoulder  ? ?Social History  ? ?Occupational History  ? Not on file  ?Tobacco Use  ? Smoking status: Former  ? Smokeless tobacco: Never  ?Vaping Use  ? Vaping Use: Every day  ?Substance and Sexual Activity  ? Alcohol use: Yes  ? Drug use: Yes  ?  Types: Marijuana  ? Sexual activity: Not on  file  ? ? ? ? ? ? ?

## 2022-01-21 ENCOUNTER — Encounter: Payer: Self-pay | Admitting: Orthopaedic Surgery

## 2022-01-23 ENCOUNTER — Ambulatory Visit (INDEPENDENT_AMBULATORY_CARE_PROVIDER_SITE_OTHER): Payer: BC Managed Care – PPO | Admitting: Family Medicine

## 2022-01-23 ENCOUNTER — Encounter (HOSPITAL_BASED_OUTPATIENT_CLINIC_OR_DEPARTMENT_OTHER): Payer: Self-pay | Admitting: Family Medicine

## 2022-01-23 DIAGNOSIS — R7303 Prediabetes: Secondary | ICD-10-CM | POA: Diagnosis not present

## 2022-01-23 DIAGNOSIS — G40909 Epilepsy, unspecified, not intractable, without status epilepticus: Secondary | ICD-10-CM | POA: Diagnosis not present

## 2022-01-23 DIAGNOSIS — K5190 Ulcerative colitis, unspecified, without complications: Secondary | ICD-10-CM

## 2022-01-23 DIAGNOSIS — Z720 Tobacco use: Secondary | ICD-10-CM

## 2022-01-23 DIAGNOSIS — F3340 Major depressive disorder, recurrent, in remission, unspecified: Secondary | ICD-10-CM | POA: Diagnosis not present

## 2022-01-23 DIAGNOSIS — Z0000 Encounter for general adult medical examination without abnormal findings: Secondary | ICD-10-CM

## 2022-01-23 MED ORDER — LOPERAMIDE HCL 2 MG PO TABS: 2.0000 mg | ORAL_TABLET | Freq: Four times a day (QID) | ORAL | 1 refills | Status: DC | PRN

## 2022-01-23 MED ORDER — ESCITALOPRAM OXALATE 10 MG PO TABS: 10.0000 mg | ORAL_TABLET | Freq: Every day | ORAL | 1 refills | Status: DC

## 2022-01-23 NOTE — Assessment & Plan Note (Signed)
History of UC with colectomy about 25 years ago We will refer patient to GI so that he may establish with a provider locally

## 2022-01-23 NOTE — Assessment & Plan Note (Signed)
Generally, patient is doing fairly well at present, however does have some active symptoms and would like to resume Lexapro to better control symptoms, feel that this is reasonable Discussed options with patient, will proceed with resuming 10 mg dose which he was on in the past Does not currently have any SI or HI.  Did discuss potential warnings related to SI or HI and to contact the office or present to local emergency department should this occur

## 2022-01-23 NOTE — Assessment & Plan Note (Signed)
Patient is interested in quitting, has used different methods in the past with some success including Chantix and patches Plan to discuss further at next visit and consider treatment options, may be a good candidate for Chantix

## 2022-01-23 NOTE — Patient Instructions (Signed)
  Medication Instructions:  Your physician recommends that you continue on your current medications as directed. Please refer to the Current Medication list given to you today. --If you need a refill on any your medications before your next appointment, please call your pharmacy first. If no refills are authorized on file call the office.-- Lab Work: Your physician has recommended that you have lab work today: No If you have labs (blood work) drawn today and your tests are completely normal, you will receive your results via MyChart message OR a phone call from our staff.  Please ensure you check your voicemail in the event that you authorized detailed messages to be left on a delegated number. If you have any lab test that is abnormal or we need to change your treatment, we will call you to review the results.  Referrals/Procedures/Imaging: No  Follow-Up: Your next appointment:   Your physician recommends that you schedule a follow-up appointment in 1-2 cpe  with Dr. de Peru.  You will receive a text message or e-mail with a link to a survey about your care and experience with [place] today! We would greatly appreciate your feedback!   Thanks for letting us be apart of your health journey!!  Primary Care and Sports Medicine   Dr. Ceasar Mons Peru   We encourage you to activate your patient portal called "MyChart".  Sign up information is provided on this After Visit Summary.  MyChart is used to connect with patients for Virtual Visits (Telemedicine).  Patients are able to view lab/test results, encounter notes, upcoming appointments, etc.  Non-urgent messages can be sent to your provider as well. To learn more about what you can do with MyChart, please visit --  [email or web address].

## 2022-01-23 NOTE — Assessment & Plan Note (Signed)
Review of labs through care everywhere indicate that prior hemoglobin A1c has been elevated within prediabetes range We will plan to recheck A1c with labs for upcoming CPE

## 2022-01-23 NOTE — Assessment & Plan Note (Signed)
Relatively new issue for patient within the last couple years, has been maintained on Keppra Will refer patient to neurologist locally so that he may establish and have further evaluation regarding this

## 2022-01-23 NOTE — Progress Notes (Signed)
New Patient Office Visit  Subjective    Patient ID: Mark Waters, male    DOB: 07-04-1986  Age: 36 y.o. MRN: [id number]  CC:  Chief Complaint  Patient presents with   New Patient (Initial Visit)    HPI Mark Waters presents to establish care Last PCP - no recent PCP  History of seizures over the past 2-3 years - has been taking Keppra. Patient was following with neurologist in [place], has not established with provider locally. Would like referral for this  History of depression, was prescribed Lexapro previously, has not been taking as he did not have insurance. Would be interested in resuming medication, he felt it did help to control symptoms. No SI or HI currently.  History of UC, had colectomy in 1998. Does not have GI provider that he is following with locally.  Has question about recent MRI imaging through orthopedic surgeon for his low back and questions about treatment options. He would prefer to avoid surgery for current symptoms. He denies any red flag symptoms  Does smoke cigarettes, would like to quit. Has tried Chantix and patches in the past. Felt that Chantix did work, ran out of medication as was only a free trial but felt he had benefit with this  Patient is originally from [place]. Patient has been living here for about 2 years. Patient works for [employer]. Outside of work, patient enjoys music festivals, sports, hiking, being outdoors.  Outpatient Encounter Medications as of 01/23/2022  Medication Sig   gabapentin (NEURONTIN) 100 MG capsule Take one at night for 5 days then 2 po q HS   levETIRAcetam (KEPPRA) 500 MG tablet Take 1,000 mg by mouth 2 (two) times daily.   methocarbamol (ROBAXIN) 500 MG tablet Take 1 tablet (500 mg total) by mouth every 8 (eight) hours as needed for muscle spasms.   escitalopram (LEXAPRO) 10 MG tablet Take 1 tablet (10 mg total) by mouth daily.   loperamide (IMODIUM A-D) 2 MG tablet Take 1 tablet (2 mg total) by mouth 4 (four)  times daily as needed for diarrhea or loose stools.   [DISCONTINUED] escitalopram (LEXAPRO) 10 MG tablet Take 10 mg by mouth daily. (Patient not taking: Reported on 01/23/2022)   [DISCONTINUED] loperamide (IMODIUM A-D) 2 MG tablet Take 2 mg by mouth 4 (four) times daily as needed for diarrhea or loose stools. (Patient not taking: Reported on 01/23/2022)   [DISCONTINUED] oxyCODONE-acetaminophen (PERCOCET/ROXICET) 5-325 MG tablet Take 1-2 tablets by mouth every 8 (eight) hours as needed for severe pain.   [DISCONTINUED] predniSONE (DELTASONE) 20 MG tablet Take 2 tablets (40 mg total) by mouth daily.   No facility-administered encounter medications on file as of 01/23/2022.    Past Medical History:  Diagnosis Date   Seizures (HCC)    Stroke (HCC)    Ulcerative colitis (HCC)     Past Surgical History:  Procedure Laterality Date   SHOULDER SURGERY Left    3 rods in left shoulder    History reviewed. No pertinent family history.  Social History   Socioeconomic History   Marital status: Single    Spouse name: Not on file   Number of children: Not on file   Years of education: Not on file   Highest education level: Not on file  Occupational History   Not on file  Tobacco Use   Smoking status: Former   Smokeless tobacco: Never  Vaping Use   Vaping Use: Every day  Substance and Sexual Activity   Alcohol  use: Yes   Drug use: Yes    Types: Marijuana   Sexual activity: Not on file  Other Topics Concern   Not on file  Social History Narrative   Not on file   Social Determinants of Health   Financial Resource Strain: Not on file  Food Insecurity: Not on file  Transportation Needs: Not on file  Physical Activity: Not on file  Stress: Not on file  Social Connections: Not on file  Intimate Partner Violence: Not on file    Objective    BP 128/84   Pulse 73   Temp 97.7 F (36.5 C) (Oral)   Ht 6\' 3"  (1.905 m)   Wt 190 lb 3.2 oz (86.3 kg)   SpO2 100%   BMI 23.77 kg/m    Physical Exam  36 year old male in no acute distress Cardiovascular exam with regular rate and rhythm, no murmur appreciated Lungs clear to auscultation bilaterally  Assessment & Plan:   Problem List Items Addressed This Visit       Digestive   Ulcerative colitis, unspecified, without complications (HCC)    History of UC with colectomy about 25 years ago We will refer patient to GI so that he may establish with a provider locally       Relevant Medications   loperamide (IMODIUM A-D) 2 MG tablet   Other Relevant Orders   Ambulatory referral to Gastroenterology     Nervous and Auditory   Seizure disorder [facility])    Relatively new issue for patient within the last couple years, has been maintained on Keppra Will refer patient to neurologist locally so that he may establish and have further evaluation regarding this       Relevant Orders   Ambulatory referral to Neurology     Other   Recurrent major depressive disorder, in remission (HCC)    Generally, patient is doing fairly well at present, however does have some active symptoms and would like to resume Lexapro to better control symptoms, feel that this is reasonable Discussed options with patient, will proceed with resuming 10 mg dose which he was on in the past Does not currently have any SI or HI.  Did discuss potential warnings related to SI or HI and to contact the office or present to local emergency department should this occur       Relevant Medications   escitalopram (LEXAPRO) 10 MG tablet   Prediabetes    Review of labs through care everywhere indicate that prior hemoglobin A1c has been elevated within prediabetes range We will plan to recheck A1c with labs for upcoming CPE       Tobacco use    Patient is interested in quitting, has used different methods in the past with some success including Chantix and patches Plan to discuss further at next visit and consider treatment options, may be a good candidate  for Chantix       Other Visit Diagnoses     Wellness examination       Relevant Orders   CBC with Differential/Platelet   Comprehensive metabolic panel   Hemoglobin A1c   Lipid panel   TSH Rfx on Abnormal to Free T4       Return in about 6 weeks (around 03/06/2022) for CPE with [facility] a few days prior.   Jolynn Bajorek J De 03/08/2022, MD

## 2022-01-24 ENCOUNTER — Ambulatory Visit (HOSPITAL_BASED_OUTPATIENT_CLINIC_OR_DEPARTMENT_OTHER): Payer: BC Managed Care – PPO | Admitting: Family Medicine

## 2022-02-17 ENCOUNTER — Ambulatory Visit: Payer: Self-pay

## 2022-02-17 ENCOUNTER — Ambulatory Visit (INDEPENDENT_AMBULATORY_CARE_PROVIDER_SITE_OTHER): Payer: PRIVATE HEALTH INSURANCE | Admitting: Physical Medicine and Rehabilitation

## 2022-02-17 ENCOUNTER — Encounter: Payer: Self-pay | Admitting: Physical Medicine and Rehabilitation

## 2022-02-17 VITALS — BP 128/90 | HR 79

## 2022-02-17 DIAGNOSIS — M5416 Radiculopathy, lumbar region: Secondary | ICD-10-CM | POA: Diagnosis not present

## 2022-02-17 MED ORDER — METHYLPREDNISOLONE ACETATE 80 MG/ML IJ SUSP
80.0000 mg | Freq: Once | INTRAMUSCULAR | Status: AC
  Administered 2022-02-17: 80 mg

## 2022-02-17 NOTE — Progress Notes (Signed)
Pt state lower back pain that travels to his buttock and down his left thigh, knee and foot. Pt state bending and sitting makes the pain worse. Pt state he uses pain meds and excise to help ease his pain.  Numeric Pain Rating Scale and Functional Assessment Average Pain 4   In the last MONTH (on 0-10 scale) has pain interfered with the following?  1. General activity like being  able to carry out your everyday physical activities such as walking, climbing stairs, carrying groceries, or moving a chair?  Rating(9)   +Driver, -BT, -Dye Allergies.

## 2022-02-17 NOTE — Patient Instructions (Signed)

## 2022-02-17 NOTE — Procedures (Signed)
Lumbosacral Transforaminal Epidural Steroid Injection - Sub-Pedicular Approach with Fluoroscopic Guidance  Patient: Mark Waters      Date of Birth: 1985-10-31 MRN: [id number] PCP: de Peru, Raymond J, MD      Visit Date: 02/17/2022   Universal Protocol:    Date/Time: 02/17/2022  Consent Given By: the patient  Position: PRONE  Additional Comments: Vital signs were monitored before and after the procedure. Patient was prepped and draped in the usual sterile fashion. The correct patient, procedure, and site was verified.   Injection Procedure Details:   Procedure diagnoses: Lumbar radiculopathy [M54.16]    Meds Administered:  Meds ordered this encounter  Medications   methylPREDNISolone acetate (DEPO-MEDROL) injection 80 mg    Laterality: Left  Location/Site: L3  Needle:5.0 in., 22 ga.  Short bevel or Quincke spinal needle  Needle Placement: Transforaminal  Findings:    -Comments: Excellent flow of contrast along the nerve, nerve root and into the epidural space.  Procedure Details: After squaring off the end-plates to get a true AP view, the C-arm was positioned so that an oblique view of the foramen as noted above was visualized. The target area is just inferior to the "nose of the scotty dog" or sub pedicular. The soft tissues overlying this structure were infiltrated with 2-3 ml. of 1% Lidocaine without Epinephrine.  The spinal needle was inserted toward the target using a "trajectory" view along the fluoroscope beam.  Under AP and lateral visualization, the needle was advanced so it did not puncture dura and was located close the 6 O'Clock position of the pedical in AP tracterory. Biplanar projections were used to confirm position. Aspiration was confirmed to be negative for CSF and/or blood. A 1-2 ml. volume of Isovue-250 was injected and flow of contrast was noted at each level. Radiographs were obtained for documentation purposes.   After attaining the desired flow  of contrast documented above, a 0.5 to 1.0 ml test dose of 0.25% Marcaine was injected into each respective transforaminal space.  The patient was observed for 90 seconds post injection.  After no sensory deficits were reported, and normal lower extremity motor function was noted,   the above injectate was administered so that equal amounts of the injectate were placed at each foramen (level) into the transforaminal epidural space.   Additional Comments:  The patient tolerated the procedure well Dressing: 2 x 2 sterile gauze and Band-Aid    Post-procedure details: Patient was observed during the procedure. Post-procedure instructions were reviewed.  Patient left the clinic in stable condition.

## 2022-02-17 NOTE — Progress Notes (Signed)
Mark Waters - 36 y.o. male MRN [id number]  Date of birth: 20-May-1986  Office Visit Note: Visit Date: 02/17/2022 PCP: Tommi Rumps [place], Buren Kos, MD Referred by: de [place], Raymond J, MD  Subjective: Chief Complaint  Patient presents with   Lower Back - Pain   Left Knee - Pain   Left Thigh - Pain   Left Foot - Pain   HPI:  Mark Waters is a 36 y.o. male who comes in today at the request of Dr. Annell Greening for planned Left L3-4 Lumbar Transforaminal epidural steroid injection with fluoroscopic guidance.  The patient has failed conservative care including home exercise, medications, time and activity modification.  This injection will be diagnostic and hopefully therapeutic.  Please see requesting physician notes for further details and justification. MRI reviewed with images and spine model.  MRI reviewed in the note below. Disc from [facility].    ROS Otherwise per HPI.  Assessment & Plan: Visit Diagnoses:    ICD-10-CM   1. Lumbar radiculopathy  M54.16 XR C-ARM NO REPORT    Epidural Steroid injection    methylPREDNISolone acetate (DEPO-MEDROL) injection 80 mg      Plan: No additional findings.   Meds & Orders:  Meds ordered this encounter  Medications   methylPREDNISolone acetate (DEPO-MEDROL) injection 80 mg    Orders Placed This Encounter  Procedures   XR C-ARM NO REPORT   Epidural Steroid injection    Follow-up: Return for  Annell Greening, MD as scheduled on 02/28/2022.   Procedures: No procedures performed  Lumbosacral Transforaminal Epidural Steroid Injection - Sub-Pedicular Approach with Fluoroscopic Guidance  Patient: Mark Waters      Date of Birth: 1986-05-20 MRN: [id number] PCP: de Peru, Raymond J, MD      Visit Date: 02/17/2022   Universal Protocol:    Date/Time: 02/17/2022  Consent Given By: the patient  Position: PRONE  Additional Comments: Vital signs were monitored before and after the procedure. Patient was prepped and draped in the usual sterile  fashion. The correct patient, procedure, and site was verified.   Injection Procedure Details:   Procedure diagnoses: Lumbar radiculopathy [M54.16]    Meds Administered:  Meds ordered this encounter  Medications   methylPREDNISolone acetate (DEPO-MEDROL) injection 80 mg    Laterality: Left  Location/Site: L3  Needle:5.0 in., 22 ga.  Short bevel or Quincke spinal needle  Needle Placement: Transforaminal  Findings:    -Comments: Excellent flow of contrast along the nerve, nerve root and into the epidural space.  Procedure Details: After squaring off the end-plates to get a true AP view, the C-arm was positioned so that an oblique view of the foramen as noted above was visualized. The target area is just inferior to the "nose of the scotty dog" or sub pedicular. The soft tissues overlying this structure were infiltrated with 2-3 ml. of 1% Lidocaine without Epinephrine.  The spinal needle was inserted toward the target using a "trajectory" view along the fluoroscope beam.  Under AP and lateral visualization, the needle was advanced so it did not puncture dura and was located close the 6 O'Clock position of the pedical in AP tracterory. Biplanar projections were used to confirm position. Aspiration was confirmed to be negative for CSF and/or blood. A 1-2 ml. volume of Isovue-250 was injected and flow of contrast was noted at each level. Radiographs were obtained for documentation purposes.   After attaining the desired flow of contrast documented above, a 0.5 to 1.0 ml test dose of 0.25%  Marcaine was injected into each respective transforaminal space.  The patient was observed for 90 seconds post injection.  After no sensory deficits were reported, and normal lower extremity motor function was noted,   the above injectate was administered so that equal amounts of the injectate were placed at each foramen (level) into the transforaminal epidural space.   Additional Comments:  The patient  tolerated the procedure well Dressing: 2 x 2 sterile gauze and Band-Aid    Post-procedure details: Patient was observed during the procedure. Post-procedure instructions were reviewed.  Patient left the clinic in stable condition.    Clinical History: No specialty comments available.     Objective:  VS:  HT:    WT:   BMI:     BP:128/90  HR:79bpm  TEMP: ( )  RESP:  Physical Exam Vitals and nursing note reviewed.  Constitutional:      General: He is not in acute distress.    Appearance: Normal appearance. He is not ill-appearing.  HENT:     Head: Normocephalic and atraumatic.     Right Ear: External ear normal.     Left Ear: External ear normal.     Nose: No congestion.  Eyes:     Extraocular Movements: Extraocular movements intact.  Cardiovascular:     Rate and Rhythm: Normal rate.     Pulses: Normal pulses.  Pulmonary:     Effort: Pulmonary effort is normal. No respiratory distress.  Abdominal:     General: There is no distension.     Palpations: Abdomen is soft.  Musculoskeletal:        General: No tenderness or signs of injury.     Cervical back: Neck supple.     Right lower leg: No edema.     Left lower leg: No edema.     Comments: Patient has good distal strength without clonus.  Skin:    Findings: No erythema or rash.  Neurological:     General: No focal deficit present.     Mental Status: He is alert and oriented to person, place, and time.     Sensory: No sensory deficit.     Motor: No weakness or abnormal muscle tone.     Coordination: Coordination normal.  Psychiatric:        Mood and Affect: Mood normal.        Behavior: Behavior normal.      Imaging: No results found.

## 2022-02-21 ENCOUNTER — Encounter: Payer: Self-pay | Admitting: Physical Medicine and Rehabilitation

## 2022-02-27 ENCOUNTER — Ambulatory Visit (HOSPITAL_BASED_OUTPATIENT_CLINIC_OR_DEPARTMENT_OTHER): Payer: BC Managed Care – PPO

## 2022-02-27 ENCOUNTER — Ambulatory Visit: Payer: BC Managed Care – PPO | Admitting: Neurology

## 2022-02-27 ENCOUNTER — Encounter: Payer: Self-pay | Admitting: Neurology

## 2022-02-27 VITALS — BP 136/89 | HR 86 | Ht 75.0 in | Wt 184.0 lb

## 2022-02-27 DIAGNOSIS — G40909 Epilepsy, unspecified, not intractable, without status epilepticus: Secondary | ICD-10-CM

## 2022-02-27 DIAGNOSIS — Z5181 Encounter for therapeutic drug level monitoring: Secondary | ICD-10-CM

## 2022-02-27 DIAGNOSIS — I63512 Cerebral infarction due to unspecified occlusion or stenosis of left middle cerebral artery: Secondary | ICD-10-CM

## 2022-02-27 MED ORDER — LEVETIRACETAM 1000 MG PO TABS: 1000.0000 mg | ORAL_TABLET | Freq: Two times a day (BID) | ORAL | 3 refills | Status: DC

## 2022-02-27 NOTE — Progress Notes (Signed)
[facility]  PATIENT: Mark Waters DOB: 01/11/1986  REQUESTING CLINICIAN: de [place], Raymond J, MD HISTORY FROM: Patient  REASON FOR VISIT: Seizure disorder    HISTORICAL  CHIEF COMPLAINT:  Chief Complaint  Patient presents with   New Patient (Initial Visit)    Room 12, alone Last sz was in April 2020, states he is doing well on keppra. Would like to discuss keppra management     HISTORY OF PRESENT ILLNESS:  This is a 36 year old gentleman past medical history of ulcerative colitis, previous stroke and seizure in 1998 who is presenting to establish care.  Patient reported in 1998 he had a bout of ulcerative colitis infection, and from there he developed clots and had a left hemispheric stroke.  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  Initially he was put on Keppra, did well and was seizure-free until 2018 when he had recurrent seizure.  At that time, his neurologist told him that the seizure might have been provoked by alcohol.  He was put back on Keppra 500 mg twice daily but have recurrent seizures therefore he was increased to 1000 mg twice daily.  Since then he has been doing well but will have breakthrough seizure in the setting of missing his dose of medication, the last seizure was 2 weeks ago and this is in the setting of missing his Keppra dose but prior to that it was 2 years ago.   With his seizure, he reports that he can tell before the seizure start, usually he does not feel right, he has an abnormal sensation that he cannot describe and sometimes he will have vision changes.  He will feel burning sensation with his right arm and sometimes he will have right hand automatisms prior to losing consciousness and passing out.  With the seizure he has bruises and tongue biting but no broken bones.    Handedness: Right handed   Onset: 1998   Seizure Type: Focal to bilateral   Current frequency: Last seizure 2 weeks but  prior to that 3 years ago   Any injuries from seizures: None   Seizure risk factors: Prior Strokes   Previous ASMs: Levetiracetam   Currenty ASMs: Levetiracetam 1000 mg BID   ASMs side effects: None   Brain Images: Old left MCA stroke  Previous EEGs:    OTHER MEDICAL CONDITIONS: Left hemispheric stroke, epilepsy   REVIEW OF SYSTEMS: Full 14 system review of systems performed and negative with exception of: as noted in the HPI   ALLERGIES: No Known Allergies  HOME MEDICATIONS: Outpatient Medications Prior to Visit  Medication Sig Dispense Refill   escitalopram (LEXAPRO) 10 MG tablet Take 1 tablet (10 mg total) by mouth daily. 90 tablet 1   levETIRAcetam (KEPPRA) 500 MG tablet Take 1,000 mg by mouth 2 (two) times daily.     gabapentin (NEURONTIN) 100 MG capsule Take one at night for 5 days then 2 po q HS 60 capsule 1   loperamide (IMODIUM A-D) 2 MG tablet Take 1 tablet (2 mg total) by mouth 4 (four) times daily as needed for diarrhea or loose stools. 30 tablet 1   methocarbamol (ROBAXIN) 500 MG tablet Take 1 tablet (500 mg total) by mouth every 8 (eight) hours as needed for muscle spasms. 50 tablet 0   No facility-administered medications prior to visit.    PAST MEDICAL HISTORY: Past Medical History:  Diagnosis Date   Seizures (HCC)  Stroke (HCC)    Ulcerative colitis (HCC)     PAST SURGICAL HISTORY: Past Surgical History:  Procedure Laterality Date   SHOULDER SURGERY Left    3 rods in left shoulder    FAMILY HISTORY: No family history on file.  SOCIAL HISTORY: Social History   Socioeconomic History   Marital status: Single    Spouse name: Not on file   Number of children: Not on file   Years of education: Not on file   Highest education level: Not on file  Occupational History   Not on file  Tobacco Use   Smoking status: Former   Smokeless tobacco: Never  Vaping Use   Vaping Use: Every day  Substance and Sexual Activity   Alcohol use: Yes    Drug use: Yes    Types: Marijuana   Sexual activity: Not on file  Other Topics Concern   Not on file  Social History Narrative   Not on file   Social Determinants of Health   Financial Resource Strain: Not on file  Food Insecurity: Not on file  Transportation Needs: Not on file  Physical Activity: Not on file  Stress: Not on file  Social Connections: Not on file  Intimate Partner Violence: Not on file    PHYSICAL EXAM  GENERAL EXAM/CONSTITUTIONAL: Vitals:  Vitals:   02/27/22 1413  BP: 136/89  Pulse: 86  Weight: 184 lb (83.5 kg)  Height: 6\' 3"  (1.905 m)   Body mass index is 23 kg/m. Wt Readings from Last 3 Encounters:  02/27/22 184 lb (83.5 kg)  01/23/22 190 lb 3.2 oz (86.3 kg)  01/08/22 188 lb (85.3 kg)   Patient is in no distress; well developed, nourished and groomed; neck is supple  EYES: Pupils round and reactive to light, Visual fields full to confrontation, Extraocular movements intacts,  No results found.  MUSCULOSKELETAL: Gait, strength, tone, movements noted in Neurologic exam below  NEUROLOGIC: MENTAL STATUS:      No data to display         awake, alert, oriented to person, place and time recent and remote memory intact normal attention and concentration language fluent, comprehension intact, naming intact fund of knowledge appropriate  CRANIAL NERVE:  2nd, 3rd, 4th, 6th - pupils equal and reactive to light, visual fields full to confrontation, extraocular muscles intact, no nystagmus 5th - facial sensation symmetric 7th - facial strength symmetric 8th - hearing intact 9th - palate elevates symmetrically, uvula midline 11th - shoulder shrug symmetric 12th - tongue protrusion midline  MOTOR:  normal bulk and tone, full strength in the BUE, BLE  SENSORY:  normal and symmetric to light touch, pinprick, temperature, vibration  COORDINATION:  finger-nose-finger, fine finger movements normal  REFLEXES:  deep tendon reflexes present  and symmetric  GAIT/STATION:  normal     DIAGNOSTIC DATA (LABS, IMAGING, TESTING) - I reviewed patient records, labs, notes, testing and imaging myself where available.  Lab Results  Component Value Date   WBC 3.2 (L) 08/20/2020   HGB 15.3 08/20/2020   HCT 43.7 08/20/2020   MCV 88.8 08/20/2020   PLT 241 08/20/2020      Component Value Date/Time   NA 138 08/20/2020 1533   K 4.2 08/20/2020 1533   CL 102 08/20/2020 1533   CO2 25 08/20/2020 1533   GLUCOSE 94 08/20/2020 1533   BUN 10 08/20/2020 1533   CREATININE 0.80 08/20/2020 1533   CALCIUM 9.6 08/20/2020 1533   PROT 8.1 08/20/2020 1533  ALBUMIN 4.5 08/20/2020 1533   AST 31 08/20/2020 1533   ALT 27 08/20/2020 1533   ALKPHOS 83 08/20/2020 1533   BILITOT 0.5 08/20/2020 1533   GFRNONAA >60 08/20/2020 1533   No results found for: "CHOL", "HDL", "LDLCALC", "LDLDIRECT", "TRIG" No results found for: "HGBA1C" No results found for: "VITAMINB12" Lab Results  Component Value Date   TSH 1.738 08/20/2020    Head CT 12/16/2020 1. Old left MCA territory infarct without acute intracranial abnormality. 2. No acute fracture or static subluxation of the cervical spine     ASSESSMENT AND PLAN  36 y.o. year old male  with history of ulcerative colitis, old left MCA territory stroke and epilepsy who is presenting to establish care.  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  I spent additional time discussing need for medication adherence and going over the seizure provoking factors.  I will continue patient on Keppra 1000 mg twice daily, also obtain a Keppra level today.  I will see him in 6 months for follow-up or sooner if worse.  Is comfortable with plans.   1. Seizure disorder ([place])   2. Cerebrovascular accident (CVA) due to occlusion of left middle cerebral artery ([employer])    3. Therapeutic drug monitoring     Patient Instructions  Continue with Keppra 1000 mg twice daily We will check a Keppra level today Driving restriction for the next 6 months Follow-up in 6 months or sooner if worse.   Per [facility] statutes, patients with seizures are not allowed to drive until they have been seizure-free for six months.  Other recommendations include using caution when using heavy equipment or power tools. Avoid working on ladders or at heights. Take showers instead of baths.  Do not swim alone.  Ensure the water temperature is not too high on the home water heater. Do not go swimming alone. Do not lock yourself in a room alone (i.e. bathroom). When caring for infants or small children, sit down when holding, feeding, or changing them to minimize risk of injury to the child in the event you have a seizure. Maintain good sleep hygiene. Avoid alcohol.  Also recommend adequate sleep, hydration, good diet and minimize stress.   During the Seizure  - First, ensure adequate ventilation and place patients on the floor on their left side  Loosen clothing around the neck and ensure the airway is patent. If the patient is clenching the teeth, do not force the mouth open with any object as this can cause severe damage - Remove all items from the surrounding that can be hazardous. The patient may be oblivious to what's happening and may not even know what he or she is doing. If the patient is confused and wandering, either gently guide him/her away and block access to outside areas - Reassure the individual and be comforting - Call 911. In most cases, the seizure ends before EMS arrives. However, there are cases when seizures may last over 3 to 5 minutes. Or the individual may have developed breathing difficulties or severe injuries. If a pregnant patient or a person with diabetes develops a seizure, it is prudent to call an ambulance. - Finally, if the patient does not regain  full consciousness, then call EMS. Most patients will remain confused for about 45 to 90 minutes after a seizure, so you  must use judgment in calling for help. - Avoid restraints but make sure the patient is in a bed with padded side rails - Place the individual in a lateral position with the neck slightly flexed; this will help the saliva drain from the mouth and prevent the tongue from falling backward - Remove all nearby furniture and other hazards from the area - Provide verbal assurance as the individual is regaining consciousness - Provide the patient with privacy if possible - Call for help and start treatment as ordered by the caregiver   After the Seizure (Postictal Stage)  After a seizure, most patients experience confusion, fatigue, muscle pain and/or a headache. Thus, one should permit the individual to sleep. For the next few days, reassurance is essential. Being calm and helping reorient the person is also of importance.  Most seizures are painless and end spontaneously. Seizures are not harmful to others but can lead to complications such as stress on the lungs, brain and the heart. Individuals with prior lung problems may develop labored breathing and respiratory distress.     Orders Placed This Encounter  Procedures   Levetiracetam level    Meds ordered this encounter  Medications   levETIRAcetam (KEPPRA) 1000 MG tablet    Sig: Take 1 tablet (1,000 mg total) by mouth 2 (two) times daily.    Dispense:  180 tablet    Refill:  3    Return in about 6 months (around 08/29/2022).    Alric Ran, MD 02/27/2022, 2:50 PM  [facility] [address], [place], [place] [address] [phone]

## 2022-02-27 NOTE — Patient Instructions (Signed)
Continue with Keppra 1000 mg twice daily We will check a Keppra level today Driving restriction for the next 6 months Follow-up in 6 months or sooner if worse.

## 2022-02-28 ENCOUNTER — Ambulatory Visit (INDEPENDENT_AMBULATORY_CARE_PROVIDER_SITE_OTHER): Payer: PRIVATE HEALTH INSURANCE | Admitting: Orthopaedic Surgery

## 2022-02-28 DIAGNOSIS — M5126 Other intervertebral disc displacement, lumbar region: Secondary | ICD-10-CM | POA: Diagnosis not present

## 2022-02-28 MED ORDER — METHOCARBAMOL 500 MG PO TABS: 500.0000 mg | ORAL_TABLET | Freq: Three times a day (TID) | ORAL | 0 refills | Status: DC | PRN

## 2022-03-01 LAB — LEVETIRACETAM LEVEL: Levetiracetam Lvl: 20.4 ug/mL (ref 10.0–40.0)

## 2022-03-03 ENCOUNTER — Ambulatory Visit (HOSPITAL_BASED_OUTPATIENT_CLINIC_OR_DEPARTMENT_OTHER): Payer: BC Managed Care – PPO

## 2022-03-04 LAB — COMPREHENSIVE METABOLIC PANEL
ALT: 27 IU/L (ref 0–44)
AST: 28 IU/L (ref 0–40)
Albumin/Globulin Ratio: 1.8 (ref 1.2–2.2)
Albumin: 4.7 g/dL (ref 4.0–5.0)
Alkaline Phosphatase: 101 IU/L (ref 44–121)
BUN/Creatinine Ratio: 12 (ref 9–20)
BUN: 10 mg/dL (ref 6–20)
Bilirubin Total: 0.2 mg/dL (ref 0.0–1.2)
CO2: 24 mmol/L (ref 20–29)
Calcium: 9.8 mg/dL (ref 8.7–10.2)
Chloride: 99 mmol/L (ref 96–106)
Creatinine, Ser: 0.84 mg/dL (ref 0.76–1.27)
Globulin, Total: 2.6 g/dL (ref 1.5–4.5)
Glucose: 94 mg/dL (ref 70–99)
Potassium: 4.4 mmol/L (ref 3.5–5.2)
Sodium: 138 mmol/L (ref 134–144)
Total Protein: 7.3 g/dL (ref 6.0–8.5)
eGFR: 117 mL/min/{1.73_m2} (ref >59)

## 2022-03-04 LAB — CBC WITH DIFFERENTIAL/PLATELET
Basophils Absolute: 0 10*3/uL (ref 0.0–0.2)
Basos: 0 %
EOS (ABSOLUTE): 0.1 10*3/uL (ref 0.0–0.4)
Eos: 2 %
Hematocrit: 38.7 % (ref 37.5–51.0)
Hemoglobin: 13.7 g/dL (ref 13.0–17.7)
Immature Grans (Abs): 0 10*3/uL (ref 0.0–0.1)
Immature Granulocytes: 0 %
Lymphocytes Absolute: 1.3 10*3/uL (ref 0.7–3.1)
Lymphs: 21 %
MCH: 31.2 pg (ref 26.6–33.0)
MCHC: 35.4 g/dL (ref 31.5–35.7)
MCV: 88 fL (ref 79–97)
Monocytes Absolute: 0.5 10*3/uL (ref 0.1–0.9)
Monocytes: 8 %
Neutrophils Absolute: 4.3 10*3/uL (ref 1.4–7.0)
Neutrophils: 69 %
Platelets: 220 10*3/uL (ref 150–450)
RBC: 4.39 x10E6/uL (ref 4.14–5.80)
RDW: 12.8 % (ref 11.6–15.4)
WBC: 6.3 10*3/uL (ref 3.4–10.8)

## 2022-03-04 LAB — HEMOGLOBIN A1C
Est. average glucose Bld gHb Est-mCnc: 128 mg/dL
Hgb A1c MFr Bld: 6.1 % — ABNORMAL HIGH (ref 4.8–5.6)

## 2022-03-04 LAB — LIPID PANEL
Chol/HDL Ratio: 3 ratio (ref 0.0–5.0)
Cholesterol, Total: 262 mg/dL — ABNORMAL HIGH (ref 100–199)
HDL: 87 mg/dL (ref >39)
LDL Chol Calc (NIH): 161 mg/dL — ABNORMAL HIGH (ref 0–99)
Triglycerides: 82 mg/dL (ref 0–149)
VLDL Cholesterol Cal: 14 mg/dL (ref 5–40)

## 2022-03-04 LAB — TSH RFX ON ABNORMAL TO FREE T4: TSH: 0.904 u[IU]/mL (ref 0.450–4.500)

## 2022-03-06 ENCOUNTER — Encounter (HOSPITAL_BASED_OUTPATIENT_CLINIC_OR_DEPARTMENT_OTHER): Payer: Self-pay | Admitting: Family Medicine

## 2022-03-07 ENCOUNTER — Ambulatory Visit: Payer: BC Managed Care – PPO | Admitting: Orthopaedic Surgery

## 2022-03-26 ENCOUNTER — Ambulatory Visit (INDEPENDENT_AMBULATORY_CARE_PROVIDER_SITE_OTHER): Payer: BC Managed Care – PPO | Admitting: Orthopaedic Surgery

## 2022-03-26 DIAGNOSIS — M5126 Other intervertebral disc displacement, lumbar region: Secondary | ICD-10-CM | POA: Diagnosis not present

## 2022-03-26 DIAGNOSIS — M5416 Radiculopathy, lumbar region: Secondary | ICD-10-CM | POA: Diagnosis not present

## 2022-03-26 MED ORDER — TRAMADOL HCL 50 MG PO TABS: 50.0000 mg | ORAL_TABLET | Freq: Two times a day (BID) | ORAL | 0 refills | Status: DC | PRN

## 2022-03-26 NOTE — Progress Notes (Signed)
Office Visit Note   Patient: Mark Waters           Date of Birth: March 11, 1986           MRN: [id number] Visit Date: 03/26/2022              Requested by: de Peru, Raymond J, MD [address] [place],  [place] [address] PCP: de Peru, Raymond J, MD   Assessment & Plan: Visit Diagnoses:  1. Lumbar radiculopathy   2. Protrusion of lumbar intervertebral disc     Plan: Work slip given continue modified work.  We will set him up for a second epidural for his extraforaminal H&P left L3-4.  Work slip given continued modified work as before x6 weeks.  I will follow him up after the epidural.  We discussed that if the epidural does not give him significantly more improvement then we can discuss operative intervention.  Follow-Up Instructions: Return in about 3 weeks (around 04/16/2022).   Orders:  Orders Placed This Encounter  Procedures   Ambulatory referral to Physical Medicine Rehab   Meds ordered this encounter  Medications   traMADol (ULTRAM) 50 MG tablet    Sig: Take 1 tablet (50 mg total) by mouth every 12 (twelve) hours as needed.    Dispense:  40 tablet    Refill:  0      Procedures: No procedures performed   Clinical Data: No additional findings.   Subjective: Chief Complaint  Patient presents with   Lower Back - Follow-up    HPI 36 year old male returns 3 weeks follow-up previous ESI 02/17/2022 that gave him a little less than 50% relief.  He states that tended to wore off some he was feeling better then mowed the lawn second and it took him several days to get out of bed.  Since that time he continues to have back pain left leg pain.  MRI scan showed previous left L3-4 extraforaminal HNP.  Patient states he has been drinking too much at night to compensate for the pain.  Neurontin did not really help and he was concerned it may have caused one of his seizures.  He does have seizure disorder history of history of major depression disorder with psychotic features.   History of CVA.  Review of Systems all other systems noncontributory HPI.   Objective: Vital Signs: There were no vitals taken for this visit.  Physical Exam Constitutional:      Appearance: He is well-developed.  HENT:     Head: Normocephalic and atraumatic.     Right Ear: External ear normal.     Left Ear: External ear normal.  Eyes:     Pupils: Pupils are equal, round, and reactive to light.  Neck:     Thyroid: No thyromegaly.     Trachea: No tracheal deviation.  Cardiovascular:     Rate and Rhythm: Normal rate.  Pulmonary:     Effort: Pulmonary effort is normal.     Breath sounds: No wheezing.  Abdominal:     General: Bowel sounds are normal.     Palpations: Abdomen is soft.  Musculoskeletal:     Cervical back: Neck supple.  Skin:    General: Skin is warm and dry.     Capillary Refill: Capillary refill takes less than 2 seconds.  Neurological:     Mental Status: He is alert and oriented to person, place, and time.  Psychiatric:        Behavior: Behavior normal.  Thought Content: Thought content normal.        Judgment: Judgment normal.     Ortho Exam patient remains on modified duty gets from sitting to standing slowly amatory with a limp.  Negative logroll the hips.  Quad anterior tib gastrocsoleus is active.  No abductor weakness.  Specialty Comments:  No specialty comments available.  Imaging: No results found.   PMFS History: Patient Active Problem List   Diagnosis Date Noted   Protrusion of lumbar intervertebral disc 02/28/2022   Prediabetes 01/23/2022   Tobacco use 01/23/2022   Current severe episode of major depressive disorder without psychotic features without prior episode [facility])    Seizure disorder (HCC) 11/28/2020   CVA (cerebral vascular accident) (HCC) 11/28/2020   Ulcerative colitis, unspecified, without complications (HCC) 06/07/2020   Recurrent major depressive disorder, in remission (HCC) 06/07/2020   Pilonidal cyst 06/07/2020    Personal history of transient ischemic attack (TIA), and cerebral infarction without residual deficits 06/21/2013   Personal history of other diseases of the digestive system 06/21/2013   Acquired absence of other specified parts of digestive tract 09/10/2011   Nicotine dependence, unspecified, uncomplicated 09/10/2011   Past Medical History:  Diagnosis Date   Seizures (HCC)    Stroke (HCC)    Ulcerative colitis (HCC)     No family history on file.  Past Surgical History:  Procedure Laterality Date   SHOULDER SURGERY Left    3 rods in left shoulder   Social History   Occupational History   Not on file  Tobacco Use   Smoking status: Former   Smokeless tobacco: Never  Vaping Use   Vaping Use: Every day  Substance and Sexual Activity   Alcohol use: Yes   Drug use: Yes    Types: Marijuana   Sexual activity: Not on file

## 2022-03-27 ENCOUNTER — Encounter (HOSPITAL_BASED_OUTPATIENT_CLINIC_OR_DEPARTMENT_OTHER): Payer: Self-pay | Admitting: Family Medicine

## 2022-04-10 ENCOUNTER — Encounter: Payer: Self-pay | Admitting: Physical Medicine and Rehabilitation

## 2022-04-10 ENCOUNTER — Ambulatory Visit (INDEPENDENT_AMBULATORY_CARE_PROVIDER_SITE_OTHER): Payer: PRIVATE HEALTH INSURANCE | Admitting: Physical Medicine and Rehabilitation

## 2022-04-10 ENCOUNTER — Ambulatory Visit: Payer: Self-pay

## 2022-04-10 VITALS — BP 163/114 | HR 70

## 2022-04-10 DIAGNOSIS — M5416 Radiculopathy, lumbar region: Secondary | ICD-10-CM | POA: Diagnosis not present

## 2022-04-10 MED ORDER — METHYLPREDNISOLONE ACETATE 80 MG/ML IJ SUSP
80.0000 mg | Freq: Once | INTRAMUSCULAR | Status: AC
  Administered 2022-04-10: 80 mg

## 2022-04-10 NOTE — Patient Instructions (Signed)

## 2022-04-10 NOTE — Progress Notes (Signed)
Pt state lower back pain that travels to his buttock and down his left thigh, knee and foot. Pt state bending and sitting makes the pain worse. Pt state he uses pain meds and excise to help ease his pain.  Numeric Pain Rating Scale and Functional Assessment Average Pain 7   In the last MONTH (on 0-10 scale) has pain interfered with the following?  1. General activity like being  able to carry out your everyday physical activities such as walking, climbing stairs, carrying groceries, or moving a chair?  Rating(9)   +Driver, -BT, -Dye Allergies.

## 2022-04-10 NOTE — Procedures (Signed)
Lumbosacral Transforaminal Epidural Steroid Injection - Sub-Pedicular Approach with Fluoroscopic Guidance  Patient: Mark Waters      Date of Birth: Jan 14, 1986 MRN: [id number] PCP: de [place], Raymond J, MD      Visit Date: 04/10/2022   Universal Protocol:    Date/Time: 04/10/2022  Consent Given By: the patient  Position: PRONE  Additional Comments: Vital signs were monitored before and after the procedure. Patient was prepped and draped in the usual sterile fashion. The correct patient, procedure, and site was verified.   Injection Procedure Details:   Procedure diagnoses: Lumbar radiculopathy [M54.16]    Meds Administered:  Meds ordered this encounter  Medications   methylPREDNISolone acetate (DEPO-MEDROL) injection 80 mg    Laterality: Left  Location/Site: L3  Needle:5.0 in., 22 ga.  Short bevel or Quincke spinal needle  Needle Placement: Transforaminal  Findings:    -Comments: Excellent flow of contrast along the nerve, nerve root and into the epidural space.  Procedure Details: After squaring off the end-plates to get a true AP view, the C-arm was positioned so that an oblique view of the foramen as noted above was visualized. The target area is just inferior to the "nose of the scotty dog" or sub pedicular. The soft tissues overlying this structure were infiltrated with 2-3 ml. of 1% Lidocaine without Epinephrine.  The spinal needle was inserted toward the target using a "trajectory" view along the fluoroscope beam.  Under AP and lateral visualization, the needle was advanced so it did not puncture dura and was located close the 6 O'Clock position of the pedical in AP tracterory. Biplanar projections were used to confirm position. Aspiration was confirmed to be negative for CSF and/or blood. A 1-2 ml. volume of Isovue-250 was injected and flow of contrast was noted at each level. Radiographs were obtained for documentation purposes.   After attaining the desired flow  of contrast documented above, a 0.5 to 1.0 ml test dose of 0.25% Marcaine was injected into each respective transforaminal space.  The patient was observed for 90 seconds post injection.  After no sensory deficits were reported, and normal lower extremity motor function was noted,   the above injectate was administered so that equal amounts of the injectate were placed at each foramen (level) into the transforaminal epidural space.   Additional Comments:  The patient tolerated the procedure well Dressing: 2 x 2 sterile gauze and Band-Aid    Post-procedure details: Patient was observed during the procedure. Post-procedure instructions were reviewed.  Patient left the clinic in stable condition.

## 2022-04-10 NOTE — Progress Notes (Signed)
Mark Waters - 36 y.o. male MRN [id number]  Date of birth: Mar 15, 1986  Office Visit Note: Visit Date: 04/10/2022 PCP: Tommi Rumps [place], Buren Kos, MD Referred by: de [place], Raymond J, MD  Subjective: Chief Complaint  Patient presents with   Lower Back - Pain   Left Leg - Pain   Left Thigh - Pain   Left Knee - Pain   Left Foot - Pain   HPI:  Mark Waters is a 36 y.o. male who comes in today for planned repeat Left L3-4  Lumbar Transforaminal epidural steroid injection with fluoroscopic guidance.  The patient has failed conservative care including home exercise, medications, time and activity modification.  This injection will be diagnostic and hopefully therapeutic.  Please see requesting physician notes for further details and justification. Patient received more than 50% pain relief from prior injection. He also mentioned some upper back pain and we discussed that the L3-4 protrusion would not be a direct cause of upper back pain. I instructed him to discuss further with Dr. Ophelia Charter.  Referring: Dr. Annell Greening   ROS Otherwise per HPI.  Assessment & Plan: Visit Diagnoses:    ICD-10-CM   1. Lumbar radiculopathy  M54.16 XR C-ARM NO REPORT    Epidural Steroid injection    methylPREDNISolone acetate (DEPO-MEDROL) injection 80 mg      Plan: No additional findings.   Meds & Orders:  Meds ordered this encounter  Medications   methylPREDNISolone acetate (DEPO-MEDROL) injection 80 mg    Orders Placed This Encounter  Procedures   XR C-ARM NO REPORT   Epidural Steroid injection    Follow-up: Return for  04/23/2022.   Procedures: No procedures performed  Lumbosacral Transforaminal Epidural Steroid Injection - Sub-Pedicular Approach with Fluoroscopic Guidance  Patient: Mark Waters      Date of Birth: 27-Feb-1986 MRN: [id number] PCP: de [place], Raymond J, MD      Visit Date: 04/10/2022   Universal Protocol:    Date/Time: 04/10/2022  Consent Given By: the patient  Position:  PRONE  Additional Comments: Vital signs were monitored before and after the procedure. Patient was prepped and draped in the usual sterile fashion. The correct patient, procedure, and site was verified.   Injection Procedure Details:   Procedure diagnoses: Lumbar radiculopathy [M54.16]    Meds Administered:  Meds ordered this encounter  Medications   methylPREDNISolone acetate (DEPO-MEDROL) injection 80 mg    Laterality: Left  Location/Site: L3  Needle:5.0 in., 22 ga.  Short bevel or Quincke spinal needle  Needle Placement: Transforaminal  Findings:    -Comments: Excellent flow of contrast along the nerve, nerve root and into the epidural space.  Procedure Details: After squaring off the end-plates to get a true AP view, the C-arm was positioned so that an oblique view of the foramen as noted above was visualized. The target area is just inferior to the "nose of the scotty dog" or sub pedicular. The soft tissues overlying this structure were infiltrated with 2-3 ml. of 1% Lidocaine without Epinephrine.  The spinal needle was inserted toward the target using a "trajectory" view along the fluoroscope beam.  Under AP and lateral visualization, the needle was advanced so it did not puncture dura and was located close the 6 O'Clock position of the pedical in AP tracterory. Biplanar projections were used to confirm position. Aspiration was confirmed to be negative for CSF and/or blood. A 1-2 ml. volume of Isovue-250 was injected and flow of contrast was noted at each level. Radiographs  were obtained for documentation purposes.   After attaining the desired flow of contrast documented above, a 0.5 to 1.0 ml test dose of 0.25% Marcaine was injected into each respective transforaminal space.  The patient was observed for 90 seconds post injection.  After no sensory deficits were reported, and normal lower extremity motor function was noted,   the above injectate was administered so that  equal amounts of the injectate were placed at each foramen (level) into the transforaminal epidural space.   Additional Comments:  The patient tolerated the procedure well Dressing: 2 x 2 sterile gauze and Band-Aid    Post-procedure details: Patient was observed during the procedure. Post-procedure instructions were reviewed.  Patient left the clinic in stable condition.     Clinical History: Lumbar Spine MRI 12/2021 MRI scan was obtained at [facility] which showed a left L3-4 foraminal disc protrusion with foraminal stenosis.     Objective:  VS:  HT:    WT:   BMI:     BP:(!) 163/114  HR:70bpm  TEMP: ( )  RESP:  Physical Exam Vitals and nursing note reviewed.  Constitutional:      General: He is not in acute distress.    Appearance: Normal appearance. He is not ill-appearing.  HENT:     Head: Normocephalic and atraumatic.     Right Ear: External ear normal.     Left Ear: External ear normal.     Nose: No congestion.  Eyes:     Extraocular Movements: Extraocular movements intact.  Cardiovascular:     Rate and Rhythm: Normal rate.     Pulses: Normal pulses.  Pulmonary:     Effort: Pulmonary effort is normal. No respiratory distress.  Abdominal:     General: There is no distension.     Palpations: Abdomen is soft.  Musculoskeletal:        General: No tenderness or signs of injury.     Cervical back: Neck supple.     Right lower leg: No edema.     Left lower leg: No edema.     Comments: Patient has good distal strength without clonus.  Skin:    Findings: No erythema or rash.  Neurological:     General: No focal deficit present.     Mental Status: He is alert and oriented to person, place, and time.     Sensory: No sensory deficit.     Motor: No weakness or abnormal muscle tone.     Coordination: Coordination normal.  Psychiatric:        Mood and Affect: Mood normal.        Behavior: Behavior normal.      Imaging: No results found.

## 2022-04-16 ENCOUNTER — Ambulatory Visit: Payer: BC Managed Care – PPO | Admitting: Orthopaedic Surgery

## 2022-04-17 ENCOUNTER — Encounter (HOSPITAL_BASED_OUTPATIENT_CLINIC_OR_DEPARTMENT_OTHER): Payer: Self-pay | Admitting: Family Medicine

## 2022-04-17 ENCOUNTER — Ambulatory Visit (HOSPITAL_BASED_OUTPATIENT_CLINIC_OR_DEPARTMENT_OTHER): Payer: BC Managed Care – PPO | Admitting: Family Medicine

## 2022-04-17 VITALS — BP 130/103 | HR 105 | Ht 75.0 in | Wt 182.8 lb

## 2022-04-17 DIAGNOSIS — F3340 Major depressive disorder, recurrent, in remission, unspecified: Secondary | ICD-10-CM | POA: Diagnosis not present

## 2022-04-17 DIAGNOSIS — R7303 Prediabetes: Secondary | ICD-10-CM

## 2022-04-17 DIAGNOSIS — R030 Elevated blood-pressure reading, without diagnosis of hypertension: Secondary | ICD-10-CM

## 2022-04-17 MED ORDER — SERTRALINE HCL 50 MG PO TABS: 50.0000 mg | ORAL_TABLET | Freq: Every day | ORAL | 1 refills | Status: DC

## 2022-04-17 NOTE — Progress Notes (Signed)
    Procedures performed today:    None.  Independent interpretation of notes and tests performed by another provider:   None.  Brief History, Exam, Impression, and Recommendations:    BP (!) 130/103   Pulse (!) 105   Ht 6\' 3"  (1.905 m)   Wt 182 lb 12.8 oz (82.9 kg)   SpO2 100%   BMI 22.85 kg/m   Recurrent major depressive disorder, in remission (HCC) Patient reports that he has been taking Lexapro 10 mg daily up until 2 to 3 weeks ago when he stopped taking it as he felt he was not getting any improvement from the medication and became frustrated.  In the past, he feels that he had good response to Lexapro, however did not notice any benefit with current use of the medication.  He has not arranged for any counseling or therapy as of yet.  Currently does not have any SI or HI Reviewed options at this time.  Given lack of response with Lexapro presently, can transition to an alternative SSRI to hopefully get better control of symptoms.  Will proceed with initiation of sertraline at low-dose of 50 mg daily.  Discussed potential side effects.  Recommend looking to arrange counseling/therapy services. Discussed it may take up to 4 to 6 weeks to see full impact from sertraline.  If continuing to have difficulty in controlling symptoms, could also consider referral to psychiatry for further assistance  Prediabetes Labs completed recently with elevated hemoglobin A1c.  It has been elevated in the past, however has progressed some since prior lab reading.  At this time it is at 6.1%.  Discussed meaning of this lab, increased risk of developing diabetes in the future.  Discussed recommendations for lifestyle modifications including diet and exercise.  Unfortunately he continues to have somewhat limited activity due to ongoing back issues.  He will be following up with his orthopedic surgeon next week Will plan to continue to monitor hemoglobin A1c intermittently  Elevated blood-pressure reading  without diagnosis of hypertension Patient had recent appointment with orthopedic surgeon and at one of the visits, was noted to have elevated blood pressure that time 160/110.  He does report that he was a bit more anxious for that visit and was also having more pain around that time.  Reviewed blood pressure at prior visits with as well as other office visits with specialists.  Most of those blood pressure readings were borderline, however they were better controlled and the one-time elevation at the orthopedic surgeons office.  Denies any current issues related to chest pain or headaches.  Does not check blood pressure regularly at home.  Does have family history of high blood pressure. Systolic reading is normal today, however with slightly elevated diastolic reading.  Discussed recommendation for intermittent monitoring of blood pressure, discussed DASH diet and provided handout Do not feel that initiation of pharmacotherapy is necessary at this time, however would recommend continued monitoring of blood pressure both in the office and at home.  Return in about 6 weeks (around 05/29/2022) for BP, depression.   ___________________________________________ Mark Waters de 05/31/2022, MD, ABFM, CAQSM Primary Care and Sports Medicine [facility]

## 2022-04-17 NOTE — Assessment & Plan Note (Signed)
Patient reports that he has been taking Lexapro 10 mg daily up until 2 to 3 weeks ago when he stopped taking it as he felt he was not getting any improvement from the medication and became frustrated.  In the past, he feels that he had good response to Lexapro, however did not notice any benefit with current use of the medication.  He has not arranged for any counseling or therapy as of yet.  Currently does not have any SI or HI Reviewed options at this time.  Given lack of response with Lexapro presently, can transition to an alternative SSRI to hopefully get better control of symptoms.  Will proceed with initiation of sertraline at low-dose of 50 mg daily.  Discussed potential side effects.  Recommend looking to arrange counseling/therapy services. Discussed it may take up to 4 to 6 weeks to see full impact from sertraline.  If continuing to have difficulty in controlling symptoms, could also consider referral to psychiatry for further assistance

## 2022-04-17 NOTE — Assessment & Plan Note (Signed)
Patient had recent appointment with orthopedic surgeon and at one of the visits, was noted to have elevated blood pressure that time 160/110.  He does report that he was a bit more anxious for that visit and was also having more pain around that time.  Reviewed blood pressure at prior visits with [place] as well as other office visits with specialists.  Most of those blood pressure readings were borderline, however they were better controlled and the one-time elevation at the orthopedic surgeons office.  Denies any current issues related to chest pain or headaches.  Does not check blood pressure regularly at home.  Does have family history of high blood pressure. Systolic reading is normal today, however with slightly elevated diastolic reading.  Discussed recommendation for intermittent monitoring of blood pressure, discussed DASH diet and provided handout Do not feel that initiation of pharmacotherapy is necessary at this time, however would recommend continued monitoring of blood pressure both in the office and at home.

## 2022-04-17 NOTE — Patient Instructions (Signed)

## 2022-04-17 NOTE — Assessment & Plan Note (Signed)
Labs completed recently with elevated hemoglobin A1c.  It has been elevated in the past, however has progressed some since prior lab reading.  At this time it is at 6.1%.  Discussed meaning of this lab, increased risk of developing diabetes in the future.  Discussed recommendations for lifestyle modifications including diet and exercise.  Unfortunately he continues to have somewhat limited activity due to ongoing back issues.  He will be following up with his orthopedic surgeon next week Will plan to continue to monitor hemoglobin A1c intermittently

## 2022-04-23 ENCOUNTER — Ambulatory Visit (INDEPENDENT_AMBULATORY_CARE_PROVIDER_SITE_OTHER): Payer: PRIVATE HEALTH INSURANCE | Admitting: Orthopaedic Surgery

## 2022-04-23 ENCOUNTER — Telehealth: Payer: Self-pay | Admitting: Orthopaedic Surgery

## 2022-04-23 ENCOUNTER — Encounter: Payer: Self-pay | Admitting: Orthopaedic Surgery

## 2022-04-23 VITALS — BP 129/86 | HR 80 | Ht 75.0 in | Wt 185.0 lb

## 2022-04-23 DIAGNOSIS — M5126 Other intervertebral disc displacement, lumbar region: Secondary | ICD-10-CM

## 2022-04-23 NOTE — Telephone Encounter (Signed)
I called patient and scheduled appointment. 

## 2022-04-23 NOTE — Progress Notes (Signed)
Office Visit Note   Patient: Mark Waters           Date of Birth: [birth year]           MRN: [id number] Visit Date: 04/23/2022              Requested by: de Peru, Raymond J, MD [address] [place],  [place] [address] PCP: de [place], Raymond J, MD   Assessment & Plan: Visit Diagnoses:  1. Protrusion of lumbar intervertebral disc     Plan: Work slip given for continued 5 hours/day work and he will return next week with his MRI scan disc and we can review this.  Patient states he is ready to proceed with surgery but MRI disc from [facility] is at home he will bring in next week and we can review it and then discuss options.  He has been on medication had 2 epidurals with persistent symptoms since April.  Follow-Up Instructions: Return in about 1 week (around 04/30/2022).   Orders:  No orders of the defined types were placed in this encounter.  No orders of the defined types were placed in this encounter.     Procedures: No procedures performed   Clinical Data: No additional findings.   Subjective: Chief Complaint  Patient presents with   Lower Back - Pain, Follow-up    OTJI 12/10/2021    HPI 36 year old returns for follow-up Worker's Comp. injury from April.  He is working 5 hours/day and states the second epidural did not work as well and is wearing off and has had recurrence of his back and leg pain.  Additional history of seizure disorders CVA smoker, prediabetes.  Review of Systems 14 point system update unchanged from 02/28/2022.   Objective: Vital Signs: BP 129/86   Pulse 80   Ht 6\' 3"  (1.905 m)   Wt 185 lb (83.9 kg)   BMI 23.12 kg/m   Physical Exam Constitutional:      Appearance: He is well-developed.  HENT:     Head: Normocephalic and atraumatic.     Right Ear: External ear normal.     Left Ear: External ear normal.  Eyes:     Pupils: Pupils are equal, round, and reactive to light.  Neck:     Thyroid: No thyromegaly.     Trachea: No  tracheal deviation.  Cardiovascular:     Rate and Rhythm: Normal rate.  Pulmonary:     Effort: Pulmonary effort is normal.     Breath sounds: No wheezing.  Abdominal:     General: Bowel sounds are normal.     Palpations: Abdomen is soft.  Musculoskeletal:     Cervical back: Neck supple.  Skin:    General: Skin is warm and dry.     Capillary Refill: Capillary refill takes less than 2 seconds.  Neurological:     Mental Status: He is alert and oriented to person, place, and time.  Psychiatric:        Behavior: Behavior normal.        Thought Content: Thought content normal.        Judgment: Judgment normal.     Ortho Exam patient ambulates without a limp negative logroll of the hips.  Quads are strong.  Specialty Comments:  Lumbar Spine MRI 12/2021 MRI scan was obtained at [facility] which showed a left L3-4 foraminal disc protrusion with foraminal stenosis.  Imaging: No results found.   PMFS History: Patient Active Problem List   Diagnosis  Date Noted   Elevated blood-pressure reading without diagnosis of hypertension 04/17/2022   Protrusion of lumbar intervertebral disc 02/28/2022   Prediabetes 01/23/2022   Tobacco use 01/23/2022   Current severe episode of major depressive disorder without psychotic features without prior episode [facility])    Seizure disorder (HCC) 11/28/2020   CVA (cerebral vascular accident) (HCC) 11/28/2020   Ulcerative colitis, unspecified, without complications (HCC) 06/07/2020   Recurrent major depressive disorder, in remission (HCC) 06/07/2020   Pilonidal cyst 06/07/2020   Personal history of transient ischemic attack (TIA), and cerebral infarction without residual deficits 06/21/2013   Personal history of other diseases of the digestive system 06/21/2013   Acquired absence of other specified parts of digestive tract 09/10/2011   Nicotine dependence, unspecified, uncomplicated 09/10/2011   Past Medical History:  Diagnosis Date   Seizures  (HCC)    Stroke (HCC)    Ulcerative colitis (HCC)     No family history on file.  Past Surgical History:  Procedure Laterality Date   SHOULDER SURGERY Left    3 rods in left shoulder   Social History   Occupational History   Not on file  Tobacco Use   Smoking status: Former   Smokeless tobacco: Never  Vaping Use   Vaping Use: Every day  Substance and Sexual Activity   Alcohol use: Yes   Drug use: Yes    Types: Marijuana   Sexual activity: Not on file

## 2022-04-23 NOTE — Telephone Encounter (Signed)
Patient was advised by Ophelia Charter to come back in 1 week no appts available until 09/06 please advise on when he should come back so I can schedule

## 2022-04-30 ENCOUNTER — Ambulatory Visit: Payer: BC Managed Care – PPO | Admitting: Orthopaedic Surgery

## 2022-05-21 ENCOUNTER — Ambulatory Visit (INDEPENDENT_AMBULATORY_CARE_PROVIDER_SITE_OTHER): Payer: PRIVATE HEALTH INSURANCE | Admitting: Orthopaedic Surgery

## 2022-05-21 ENCOUNTER — Encounter: Payer: Self-pay | Admitting: Orthopaedic Surgery

## 2022-05-21 VITALS — BP 143/90 | HR 91 | Ht 75.0 in | Wt 185.0 lb

## 2022-05-21 DIAGNOSIS — M5126 Other intervertebral disc displacement, lumbar region: Secondary | ICD-10-CM | POA: Diagnosis not present

## 2022-05-21 NOTE — Addendum Note (Signed)
Addended by: Rogers Seeds on: 05/21/2022 11:06 AM   Modules accepted: Orders

## 2022-05-21 NOTE — Progress Notes (Signed)
Office Visit Note   Patient: Mark Waters           Date of Birth: Jan 01, 1986           MRN: [id number] Visit Date: 05/21/2022              Requested by: de [place], Raymond J, MD [address] [place],  [place] [address] PCP: de [place], Raymond J, MD   Assessment & Plan: Visit Diagnoses:  1. Protrusion of lumbar intervertebral disc     Plan: Patient needs repeat MRI scan with ongoing symptoms difficult history and continued significant pain complaints he may have a increased size of the disc protrusion and may have nerve compression at this point.  The MRI scan does not show increased compression then we should be able to advance his ramp up return to work schedule.  No given that he was seen today in the office he can continue his current work schedule pending new MRI review.  Follow-Up Instructions: No follow-ups on file.   Orders:  No orders of the defined types were placed in this encounter.  No orders of the defined types were placed in this encounter.     Procedures: No procedures performed   Clinical Data: No additional findings.   Subjective: Chief Complaint  Patient presents with   Lower Back - Follow-up, Pain    MRI review [facility]I 12/10/2021    HPI patient turns brought with him at this visit the MRI from 12/31/2021 done at [facility] and this is reviewed today.  It showed small disc protrusion on the left at L3-4 without nerve root displacement.  No central or lateral recess stenosis.  There is a trace desiccation of the L3-4 disc.  Additionally patient's had seizure disorder history of major depression and psychotic features.  History of CVA.  Patient states he sometimes felt pain in his groin pain in the left side of his back is at the mid upper lumbar region sometimes shoots up toward his scapula sometimes into his buttocks his pain tends to wax and wane.  He describes pain sometimes down his leg sometimes medially in the groin sometimes laterally in the  thigh or medial distal thigh.  Patient again states he is ready proceed with surgery if that would help his problem.  Review of Systems all systems noncontributory other than as mentioned in HPI.   Objective: Vital Signs: BP (!) 143/90   Pulse 91   Ht 6\' 3"  (1.905 m)   Wt 185 lb (83.9 kg)   BMI 23.12 kg/m   Physical Exam Constitutional:      Appearance: He is well-developed.  HENT:     Head: Normocephalic and atraumatic.     Right Ear: External ear normal.     Left Ear: External ear normal.  Eyes:     Pupils: Pupils are equal, round, and reactive to light.  Neck:     Thyroid: No thyromegaly.     Trachea: No tracheal deviation.  Cardiovascular:     Rate and Rhythm: Normal rate.  Pulmonary:     Effort: Pulmonary effort is normal.     Breath sounds: No wheezing.  Abdominal:     General: Bowel sounds are normal.     Palpations: Abdomen is soft.  Musculoskeletal:     Cervical back: Neck supple.  Skin:    General: Skin is warm and dry.     Capillary Refill: Capillary refill takes less than 2 seconds.  Neurological:  Mental Status: He is alert and oriented to person, place, and time.  Psychiatric:        Behavior: Behavior normal.        Thought Content: Thought content normal.        Judgment: Judgment normal.     Ortho Exam patient is able to heel and toe walk he complains of pain left side of his back with heel and toe walking and is somewhat tremulous.  EMG nerve ankle jerk are intact negative logroll the hips negative straight leg raising 90 degrees.  Negative pinch test.  Specialty Comments:  Lumbar Spine MRI 12/2021 MRI scan was obtained at [facility] which showed a left L3-4 foraminal disc protrusion with foraminal stenosis.  Imaging: No results found.   PMFS History: Patient Active Problem List   Diagnosis Date Noted   Elevated blood-pressure reading without diagnosis of hypertension 04/17/2022   Protrusion of lumbar intervertebral disc  02/28/2022   Prediabetes 01/23/2022   Tobacco use 01/23/2022   Current severe episode of major depressive disorder without psychotic features without prior episode [facility])    Seizure disorder (HCC) 11/28/2020   CVA (cerebral vascular accident) (HCC) 11/28/2020   Ulcerative colitis, unspecified, without complications (HCC) 06/07/2020   Recurrent major depressive disorder, in remission (HCC) 06/07/2020   Pilonidal cyst 06/07/2020   Personal history of transient ischemic attack (TIA), and cerebral infarction without residual deficits 06/21/2013   Personal history of other diseases of the digestive system 06/21/2013   Acquired absence of other specified parts of digestive tract 09/10/2011   Nicotine dependence, unspecified, uncomplicated 09/10/2011   Past Medical History:  Diagnosis Date   Seizures (HCC)    Stroke (HCC)    Ulcerative colitis (HCC)     No family history on file.  Past Surgical History:  Procedure Laterality Date   SHOULDER SURGERY Left    3 rods in left shoulder   Social History   Occupational History   Not on file  Tobacco Use   Smoking status: Former   Smokeless tobacco: Never  Vaping Use   Vaping Use: Every day  Substance and Sexual Activity   Alcohol use: Yes   Drug use: Yes    Types: Marijuana   Sexual activity: Not on file

## 2022-05-29 ENCOUNTER — Ambulatory Visit (HOSPITAL_BASED_OUTPATIENT_CLINIC_OR_DEPARTMENT_OTHER): Payer: BC Managed Care – PPO | Admitting: Family Medicine

## 2022-06-08 ENCOUNTER — Other Ambulatory Visit (HOSPITAL_BASED_OUTPATIENT_CLINIC_OR_DEPARTMENT_OTHER): Payer: Self-pay | Admitting: Family Medicine

## 2022-06-08 DIAGNOSIS — F3340 Major depressive disorder, recurrent, in remission, unspecified: Secondary | ICD-10-CM

## 2022-06-17 ENCOUNTER — Ambulatory Visit (INDEPENDENT_AMBULATORY_CARE_PROVIDER_SITE_OTHER): Payer: PRIVATE HEALTH INSURANCE | Admitting: Orthopaedic Surgery

## 2022-06-17 ENCOUNTER — Encounter: Payer: Self-pay | Admitting: Orthopaedic Surgery

## 2022-06-17 VITALS — BP 133/85 | HR 87 | Ht 75.0 in | Wt 185.0 lb

## 2022-06-17 DIAGNOSIS — M5126 Other intervertebral disc displacement, lumbar region: Secondary | ICD-10-CM

## 2022-06-17 NOTE — Progress Notes (Signed)
Office Visit Note   Patient: Mark Waters           Date of Birth: May 02, 1986           MRN: [id number] Visit Date: 06/17/2022              Requested by: de [place], Raymond J, MD [address] [place],  [place] [address] PCP: de [place], Raymond J, MD   Assessment & Plan: Visit Diagnoses:  1. Protrusion of lumbar intervertebral disc     Plan: Discussed with patient there is no change in his MRI of which shows small disc protrusion without compression.  Work slip given for regular work full duty without restrictions.  I plan to recheck him in 3 months and we will assign him an impairment rating in line with his single level disc protrusion.  Follow-Up Instructions: Return in about 3 months (around 09/17/2022).   Orders:  No orders of the defined types were placed in this encounter.  No orders of the defined types were placed in this encounter.     Procedures: No procedures performed   Clinical Data: No additional findings.   Subjective: Chief Complaint  Patient presents with   Lower Back - Pain, Follow-up    MRI review [facility]JI 12/10/2021    HPI 36 year old male returns for follow-up on-the-job injury/4/23 and he has been working 5 hours/day restricted duty.  New MRI scan has been obtained which again shows small left L3-4 disc protrusion extending in the foramina without foraminal or central stenosis.  Comparison to previous MRI shows its unchanged.  Patient states he has been using a back brace which is helped.  In the past he has been through therapy, medications, epidural steroids.  History of symptoms very.  He has had past history of seizure disorder previous CVA.  Patient is a smoker.  Review of Systems updated unchanged from 02/28/2022 office visit other than as mentioned in HPI.   Objective: Vital Signs: BP 133/85   Pulse 87   Ht 6\' 3"  (1.905 m)   Wt 185 lb (83.9 kg)   BMI 23.12 kg/m   Physical Exam Constitutional:      Appearance: He is well-developed.   HENT:     Head: Normocephalic and atraumatic.     Right Ear: External ear normal.     Left Ear: External ear normal.  Eyes:     Pupils: Pupils are equal, round, and reactive to light.  Neck:     Thyroid: No thyromegaly.     Trachea: No tracheal deviation.  Cardiovascular:     Rate and Rhythm: Normal rate.  Pulmonary:     Effort: Pulmonary effort is normal.     Breath sounds: No wheezing.  Abdominal:     General: Bowel sounds are normal.     Palpations: Abdomen is soft.  Musculoskeletal:     Cervical back: Neck supple.  Skin:    General: Skin is warm and dry.     Capillary Refill: Capillary refill takes less than 2 seconds.  Neurological:     Mental Status: He is alert and oriented to person, place, and time.  Psychiatric:        Behavior: Behavior normal.        Thought Content: Thought content normal.        Judgment: Judgment normal.     Ortho Exam patient moves slowly from sitting to standing.  He ambulates with apparent limp that is variable.  Some findings of symptom  magnification.  Patient tremulous with attempts at toe walking.  Radicular symptoms with logroll the hips.  Reflexes are normal pulses are normal no atrophy of the lower extremities.  Specialty Comments:  Lumbar Spine MRI 12/2021 MRI scan was obtained at [facility] which showed a left L3-4 foraminal disc protrusion with foraminal stenosis.  Imaging: L-spine -- Magnetic Resonance   Impression   Small left paracentral disc protrusion at L3-L4 without canal or foraminal stenosis. Narrative  MRI LUMBAR  SPINE WITHOUT CONTRAST, 06/03/2022 6:54 PM   INDICATION:  \ M51.26 Ruptured lumbar intervertebral disc .   COMPARISON:  None.   TECHNIQUE: Multiplanar, multisequence surface-coil magnetic resonance imaging of the lumbar spine was performed without contrast.   LEVELS IMAGED: Lower thoracic to the upper sacral region.   FINDINGS:     Alignment:  No substantial subluxation.   Vertebrae:   Vertebral body heights are maintained. No marrow signal abnormalities to suggest neoplasm. Mild disc desiccation at L3-L4. Disc space heights are maintained. Congenitally short pedicles.   Conus medullaris:  In normal position. Normal signal and contour.   Degenerative changes:   T12-L1:  No substantial canal or foraminal stenosis.  L1-L2:  No substantial canal or foraminal stenosis.  L2-L3:  No substantial canal or foraminal stenosis.  L3-L4:  Small left paracentral disc protrusion without canal or foraminal stenosis.  L4-L5:  No substantial canal or foraminal stenosis.  L5-S1:  No substantial canal or foraminal stenosis.   Upper Sacrum:  No focal lesion identified.   Additional comments:  None. Procedure Note  Doran Stabler, MD - 06/04/2022  Formatting of this note might be different from the original.  MRI LUMBAR  SPINE WITHOUT CONTRAST, 06/03/2022 6:54 PM   INDICATION:  \ M51.26 Ruptured lumbar intervertebral disc .   COMPARISON:  None.   TECHNIQUE: Multiplanar, multisequence surface-coil magnetic resonance imaging of the lumbar spine was performed without contrast.   LEVELS IMAGED: Lower thoracic to the upper sacral region.   FINDINGS:     Alignment:  No substantial subluxation.   Vertebrae:  Vertebral body heights are maintained. No marrow signal abnormalities to suggest neoplasm. Mild disc desiccation at L3-L4. Disc space heights are maintained. Congenitally short pedicles.   Conus medullaris:  In normal position. Normal signal and contour.   Degenerative changes:   T12-L1:  No substantial canal or foraminal stenosis.  L1-L2:  No substantial canal or foraminal stenosis.  L2-L3:  No substantial canal or foraminal stenosis.  L3-L4:  Small left paracentral disc protrusion without canal or foraminal stenosis.  L4-L5:  No substantial canal or foraminal stenosis.  L5-S1:  No substantial canal or foraminal stenosis.   Upper Sacrum:  No focal lesion identified.    Additional comments:  None.   CONCLUSION:   Small left paracentral disc protrusion at L3-L4 without canal or foraminal stenosis. 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 episode [facility])    Seizure disorder (HCC) 11/28/2020   CVA (cerebral vascular accident) (HCC) 11/28/2020   Ulcerative colitis, unspecified, without complications (HCC) 06/07/2020   Recurrent major depressive disorder, in remission (HCC) 06/07/2020   Pilonidal cyst 06/07/2020   Personal history of transient ischemic attack (TIA), and cerebral  infarction without residual deficits 06/21/2013   Personal history of other diseases of the digestive system 06/21/2013   Acquired absence of other specified parts of digestive tract 09/10/2011   Nicotine dependence, unspecified, uncomplicated 09/10/2011   Past Medical History:  Diagnosis Date   Seizures (HCC)    Stroke (HCC)    Ulcerative colitis (HCC)     No family history on file.  Past Surgical History:  Procedure Laterality Date   SHOULDER SURGERY Left    3 rods in left shoulder   Social History   Occupational History   Not on file  Tobacco Use   Smoking status: Former   Smokeless tobacco: Never  Vaping Use   Vaping Use: Every day  Substance and Sexual Activity   Alcohol use: Yes   Drug use: Yes    Types: Marijuana   Sexual activity: Not on file

## 2022-07-07 ENCOUNTER — Ambulatory Visit (HOSPITAL_BASED_OUTPATIENT_CLINIC_OR_DEPARTMENT_OTHER): Payer: BC Managed Care – PPO | Admitting: Family Medicine

## 2022-07-07 ENCOUNTER — Encounter (HOSPITAL_BASED_OUTPATIENT_CLINIC_OR_DEPARTMENT_OTHER): Payer: Self-pay | Admitting: Family Medicine

## 2022-07-07 VITALS — BP 137/107 | HR 87 | Temp 97.8°F | Ht 75.0 in | Wt 190.9 lb

## 2022-07-07 DIAGNOSIS — R030 Elevated blood-pressure reading, without diagnosis of hypertension: Secondary | ICD-10-CM

## 2022-07-07 DIAGNOSIS — M5126 Other intervertebral disc displacement, lumbar region: Secondary | ICD-10-CM | POA: Diagnosis not present

## 2022-07-07 DIAGNOSIS — R7303 Prediabetes: Secondary | ICD-10-CM

## 2022-07-07 NOTE — Assessment & Plan Note (Signed)
Patient has been following with orthopedic surgeon locally, reports that he did have recent MRI of the lumbar spine at [facility] as follow-up.  He indicates that he did discuss with the orthopedic surgeon that he was having pain through lumbar and thoracic spine along left aspect through paraspinal muscles and had requested to have "full" spine imaging done but only lumbar was completed.  Indicates that he has been advised that he can gradually return to full work status.  Given that he does have continued symptoms, he is interested in having second opinion and seeing if additional imaging is needed.  His original issue was being managed through [employer]  Discussed that in some cases, this can cause issues to ensure that evaluation and treatment are billed appropriately.  He states understanding and awareness of this. Discussed options with patient, elected to proceed with referral to [facility], referral placed today.  Advised that he should bring any prior records and imaging with him to that appointment

## 2022-07-07 NOTE — Assessment & Plan Note (Signed)
Most recent labs with elevated hemoglobin A1c at 6.1%.  Discussed need for monitoring related to this, we will plan to recheck hemoglobin A1c around time of next office visit within the next couple months.  Reviewed lifestyle modifications.  Plan to check hemoglobin A1c at next visit.

## 2022-07-07 NOTE — Assessment & Plan Note (Addendum)
Blood pressure continues to be elevated.  Recheck today does show improvement, however diastolic in particular remains notably elevated.  He reports that part of that is related to physical activity as he walked here for his appointment from [facility] locally. We will need to continue to monitor closely.  Recommend that he monitor blood pressure outside the office as well.  Recommend DASH diet.

## 2022-07-07 NOTE — Patient Instructions (Addendum)
  Medication Instructions:  Your physician recommends that you continue on your current medications as directed. Please refer to the Current Medication list given to you today. --If you need a refill on any your medications before your next appointment, please call your pharmacy first. If no refills are authorized on file call the office.-- Lab Work: Your physician has recommended that you have lab work today: No If you have labs (blood work) drawn today and your tests are completely normal, you will receive your results via [place] a phone call from our staff.  Please ensure you check your voicemail in the event that you authorized detailed messages to be left on a delegated number. If you have any lab test that is abnormal or we need to change your treatment, we will call you to review the results.  Referrals/Procedures/Imaging: No  Follow-Up: Your next appointment:   Your physician recommends that you schedule a follow-up appointment in: 4-6 weeks for BP and PreDM with Dr. de [place].  You will receive a text message or e-mail with a link to a survey about your care and experience with [place] today! We would greatly appreciate your feedback!   Thanks for letting us be apart of your health journey!!  Primary Care and Sports Medicine   Dr. Arlina Robes [place]   We encourage you to activate your patient portal called "MyChart".  Sign up information is provided on this After Visit Summary.  MyChart is used to connect with patients for Virtual Visits (Telemedicine).  Patients are able to view lab/test results, encounter notes, upcoming appointments, etc.  Non-urgent messages can be sent to your provider as well. To learn more about what you can do with MyChart, please visit --  [email or web address].

## 2022-07-07 NOTE — Progress Notes (Signed)
    Procedures performed today:    None.  Independent interpretation of notes and tests performed by another provider:   None.  Brief History, Exam, Impression, and Recommendations:    BP (!) 137/107   Pulse 87   Temp 97.8 F (36.6 C) (Oral)   Ht 6\' 3"  (1.905 m)   Wt 190 lb 14.4 oz (86.6 kg)   SpO2 100%   BMI 23.86 kg/m   Protrusion of lumbar intervertebral disc Patient has been following with orthopedic surgeon locally, reports that he did have recent MRI of the lumbar spine at [facility] as follow-up.  He indicates that he did discuss with the orthopedic surgeon that he was having pain through lumbar and thoracic spine along left aspect through paraspinal muscles and had requested to have "full" spine imaging done but only lumbar was completed.  Indicates that he has been advised that he can gradually return to full work status.  Given that he does have continued symptoms, he is interested in having second opinion and seeing if additional imaging is needed.  His original issue was being managed through [employer]  Discussed that in some cases, this can cause issues to ensure that evaluation and treatment are billed appropriately.  He states understanding and awareness of this. Discussed options with patient, elected to proceed with referral to [facility], referral placed today.  Advised that he should bring any prior records and imaging with him to that appointment  Prediabetes Most recent labs with elevated hemoglobin A1c at 6.1%.  Discussed need for monitoring related to this, we will plan to recheck hemoglobin A1c around time of next office visit within the next couple months.  Reviewed lifestyle modifications.  Plan to check hemoglobin A1c at next visit.  Elevated blood-pressure reading without diagnosis of hypertension Blood pressure continues to be elevated.  Recheck today does show improvement, however diastolic in particular remains notably  elevated.  He reports that part of that is related to physical activity as he walked here for his appointment from [facility] locally. We will need to continue to monitor closely.  Recommend that he monitor blood pressure outside the office as well.  Recommend DASH diet.  Return in about 6 weeks (around 08/18/2022) for BP, PreDM.   ___________________________________________ Mylin Gignac de [place], MD, ABFM, [facility] and [place]

## 2022-07-10 ENCOUNTER — Other Ambulatory Visit (HOSPITAL_BASED_OUTPATIENT_CLINIC_OR_DEPARTMENT_OTHER): Payer: Self-pay | Admitting: Family Medicine

## 2022-07-10 DIAGNOSIS — F3340 Major depressive disorder, recurrent, in remission, unspecified: Secondary | ICD-10-CM

## 2022-08-07 ENCOUNTER — Encounter (HOSPITAL_BASED_OUTPATIENT_CLINIC_OR_DEPARTMENT_OTHER): Payer: Self-pay

## 2022-08-15 DIAGNOSIS — Z823 Family history of stroke: Secondary | ICD-10-CM | POA: Diagnosis not present

## 2022-08-15 DIAGNOSIS — Z833 Family history of diabetes mellitus: Secondary | ICD-10-CM | POA: Diagnosis not present

## 2022-08-15 DIAGNOSIS — R69 Illness, unspecified: Secondary | ICD-10-CM | POA: Diagnosis not present

## 2022-08-15 DIAGNOSIS — Z5986 Financial insecurity: Secondary | ICD-10-CM | POA: Diagnosis not present

## 2022-08-15 DIAGNOSIS — Z5948 Other specified lack of adequate food: Secondary | ICD-10-CM | POA: Diagnosis not present

## 2022-08-15 DIAGNOSIS — G40909 Epilepsy, unspecified, not intractable, without status epilepticus: Secondary | ICD-10-CM | POA: Diagnosis not present

## 2022-08-15 DIAGNOSIS — Z83438 Family history of other disorder of lipoprotein metabolism and other lipidemia: Secondary | ICD-10-CM | POA: Diagnosis not present

## 2022-08-15 DIAGNOSIS — Z803 Family history of malignant neoplasm of breast: Secondary | ICD-10-CM | POA: Diagnosis not present

## 2022-08-15 DIAGNOSIS — Z720 Tobacco use: Secondary | ICD-10-CM | POA: Diagnosis not present

## 2022-08-15 DIAGNOSIS — Z8249 Family history of ischemic heart disease and other diseases of the circulatory system: Secondary | ICD-10-CM | POA: Diagnosis not present

## 2022-08-18 ENCOUNTER — Ambulatory Visit (HOSPITAL_BASED_OUTPATIENT_CLINIC_OR_DEPARTMENT_OTHER): Payer: BC Managed Care – PPO | Admitting: Family Medicine

## 2022-08-18 ENCOUNTER — Encounter (HOSPITAL_BASED_OUTPATIENT_CLINIC_OR_DEPARTMENT_OTHER): Payer: Self-pay | Admitting: Family Medicine

## 2022-08-18 VITALS — BP 120/88 | HR 64 | Temp 97.6°F | Ht 75.0 in | Wt 190.0 lb

## 2022-08-18 DIAGNOSIS — Z0000 Encounter for general adult medical examination without abnormal findings: Secondary | ICD-10-CM

## 2022-08-18 DIAGNOSIS — R030 Elevated blood-pressure reading, without diagnosis of hypertension: Secondary | ICD-10-CM

## 2022-08-18 DIAGNOSIS — R7303 Prediabetes: Secondary | ICD-10-CM

## 2022-08-18 NOTE — Assessment & Plan Note (Signed)
Blood pressure is better controlled in office today.  He reports that he is no longer working at [employer] and thus today he did not have a long walk prior to his appointment.  He has been checking blood pressure occasionally at home and reports that readings have been good and similar to reading in office today.  No current issues with chest pain or headaches. Recommend continuing with intermittent monitoring of blood pressure at home, DASH diet

## 2022-08-18 NOTE — Addendum Note (Signed)
Addended by: DE Peru, Reshad Saab J on: 08/18/2022 04:00 PM   Modules accepted: Orders, Level of Service

## 2022-08-18 NOTE — Progress Notes (Signed)
    Procedures performed today:    None.  Independent interpretation of notes and tests performed by another provider:   None.  Brief History, Exam, Impression, and Recommendations:    BP 120/88 (BP Location: Left Arm, Patient Position: Sitting, Cuff Size: Large)   Pulse 64   Temp 97.6 F (36.4 C) (Oral)   Ht 6\' 3"  (1.905 m)   Wt 190 lb (86.2 kg)   SpO2 100%   BMI 23.75 kg/m   Prediabetes Recent hemoglobin A1c elevated within prediabetes range at 6.1%.  This was about 5-1/2 months ago.  He does indicate that he has made some lifestyle modifications.  No new issues such as polyuria or polydipsia We will proceed with checking hemoglobin A1c today for monitoring Recommend continuing with lifestyle modifications at this time, handout provided  Elevated blood-pressure reading without diagnosis of hypertension Blood pressure is better controlled in office today.  He reports that he is no longer working at and thus today he did not have a long walk prior to his appointment.  He has been checking blood pressure occasionally at home and reports that readings have been good and similar to reading in office today.  No current issues with chest pain or headaches. Recommend continuing with intermittent monitoring of blood pressure at home, DASH diet  Return in about 6 months (around 02/17/2023) for CPE with [facility] a few days prior.   ___________________________________________ Korissa Horsford de 04/19/2023, MD, ABFM, CAQ[facility] Primary Care and Sports Medicine [facility]

## 2022-08-18 NOTE — Assessment & Plan Note (Signed)
Recent hemoglobin A1c elevated within prediabetes range at 6.1%.  This was about 5-1/2 months ago.  He does indicate that he has made some lifestyle modifications.  No new issues such as polyuria or polydipsia We will proceed with checking hemoglobin A1c today for monitoring Recommend continuing with lifestyle modifications at this time, handout provided

## 2022-08-18 NOTE — Patient Instructions (Signed)
  Medication Instructions:  Your physician recommends that you continue on your current medications as directed. Please refer to the Current Medication list given to you today. --If you need a refill on any your medications before your next appointment, please call your pharmacy first. If no refills are authorized on file call the office.-- Lab Work: Your physician has recommended that you have lab work today: Yes If you have labs (blood work) drawn today and your tests are completely normal, you will receive your results via MyChart message OR a phone call from our staff.  Please ensure you check your voicemail in the event that you authorized detailed messages to be left on a delegated number. If you have any lab test that is abnormal or we need to change your treatment, we will call you to review the results.  Referrals/Procedures/Imaging: No  Follow-Up: Your next appointment:   Your physician recommends that you schedule a follow-up appointment in: 4-6 months with Dr. de [place].  You will receive a text message or e-mail with a link to a survey about your care and experience with us today! We would greatly appreciate your feedback!   Thanks for letting us be apart of your health journey!!  Primary Care and Sports Medicine   Dr. Raymond de [place]   We encourage you to activate your patient portal called "MyChart".  Sign up information is provided on this After Visit Summary.  MyChart is used to connect with patients for Virtual Visits (Telemedicine).  Patients are able to view lab/test results, encounter notes, upcoming appointments, etc.  Non-urgent messages can be sent to your provider as well. To learn more about what you can do with MyChart, please visit --  [email or web address].    

## 2022-08-19 LAB — HEMOGLOBIN A1C
Est. average glucose Bld gHb Est-mCnc: 128 mg/dL
Hgb A1c MFr Bld: 6.1 % — ABNORMAL HIGH (ref 4.8–5.6)

## 2022-08-20 ENCOUNTER — Telehealth: Payer: Self-pay | Admitting: Neurology

## 2022-08-20 NOTE — Telephone Encounter (Signed)
Pt had called and left message to schedule f/u appt for driving restricted lifted. Attempted twice to contact pt, was a busy signal.

## 2022-08-20 NOTE — Telephone Encounter (Signed)
Attempted again to contact pt at 4:00 pm, still get a busy signal.

## 2022-09-17 ENCOUNTER — Ambulatory Visit (INDEPENDENT_AMBULATORY_CARE_PROVIDER_SITE_OTHER): Payer: PRIVATE HEALTH INSURANCE | Admitting: Orthopaedic Surgery

## 2022-09-17 ENCOUNTER — Encounter: Payer: Self-pay | Admitting: Orthopaedic Surgery

## 2022-09-17 VITALS — BP 141/83 | HR 87 | Ht 75.0 in | Wt 190.0 lb

## 2022-09-17 DIAGNOSIS — M5126 Other intervertebral disc displacement, lumbar region: Secondary | ICD-10-CM | POA: Diagnosis not present

## 2022-09-17 NOTE — Progress Notes (Signed)
Office Visit Note   Patient: Mark Waters           Date of Birth: 14-Sep-1985           MRN: [id number] Visit Date: 09/17/2022              Requested by: de [place], Raymond J, MD [address] [place],  [place] [address] PCP: de [place], Raymond J, MD   Assessment & Plan: Visit Diagnoses:  1. Protrusion of lumbar intervertebral disc     Plan: Patient is at [facility].  He had small disc protrusion impairment rating is 5% of the back.  Patient is rated and released.  He can follow-up on an as-needed basis.  Follow-Up Instructions: Return if symptoms worsen or fail to improve.   Orders:  No orders of the defined types were placed in this encounter.  No orders of the defined types were placed in this encounter.     Procedures: No procedures performed   Clinical Data: No additional findings.   Subjective: Chief Complaint  Patient presents with   Lower Back - Pain, Follow-up    OTJi 12/10/2021    HPI 37 year old male returns for follow-up on-the-job injury from 12/10/2021.  Patient states that he has been let go by [employer].  He is get some other job plans.  He continues to have some problems with back pains and pain that radiates into his left leg.  MRI scan was done which showed small left L3-4 disc protrusion.  Patient continues to have problems with symptoms into his left leg.  He has been through therapy anti-inflammatories epidural steroid injection 04/10/2022.  Review of Systems updated unchanged.   Objective: Vital Signs: BP (!) 141/83   Pulse 87   Ht 6\' 3"  (1.905 m)   Wt 190 lb (86.2 kg)   BMI 23.75 kg/m   Physical Exam Constitutional:      Appearance: He is well-developed.  HENT:     Head: Normocephalic and atraumatic.     Right Ear: External ear normal.     Left Ear: External ear normal.  Eyes:     Pupils: Pupils are equal, round, and reactive to light.  Neck:     Thyroid: No thyromegaly.     Trachea: No tracheal deviation.  Cardiovascular:     Rate and  Rhythm: Normal rate.  Pulmonary:     Effort: Pulmonary effort is normal.     Breath sounds: No wheezing.  Abdominal:     General: Bowel sounds are normal.     Palpations: Abdomen is soft.  Musculoskeletal:     Cervical back: Neck supple.  Skin:    General: Skin is warm and dry.     Capillary Refill: Capillary refill takes less than 2 seconds.  Neurological:     Mental Status: He is alert and oriented to person, place, and time.  Psychiatric:        Behavior: Behavior normal.        Thought Content: Thought content normal.        Judgment: Judgment normal.     Ortho Exam patient has normal heel-toe gait.  Reflexes are intact.  No atrophy no skin changes.  Specialty Comments:  Lumbar Spine MRI 12/2021 MRI scan was obtained at [facility] which showed a left L3-4 foraminal disc protrusion with foraminal stenosis.  Imaging: No results found.   PMFS History: Patient Active Problem List   Diagnosis Date Noted   Elevated blood-pressure reading without diagnosis of  hypertension 04/17/2022   Protrusion of lumbar intervertebral disc 02/28/2022   Prediabetes 01/23/2022   Tobacco use 01/23/2022   Current severe episode of major depressive disorder without psychotic features without prior episode [facility])    Seizure disorder ([place]) 11/28/2020   CVA (cerebral vascular accident) ([place]) 11/28/2020   Ulcerative colitis, unspecified, without complications ([place]) 11/94/1740   Recurrent major depressive disorder, in remission ([place]) 06/07/2020   Pilonidal cyst 06/07/2020   Personal history of transient ischemic attack (TIA), and cerebral infarction without residual deficits 06/21/2013   Personal history of other diseases of the digestive system 06/21/2013   Acquired absence of other specified parts of digestive tract 09/10/2011   Nicotine dependence, unspecified, uncomplicated 81/44/8185   Past Medical History:  Diagnosis Date   Seizures ([place])    Stroke ([place])    Ulcerative colitis  ([place])     No family history on file.  Past Surgical History:  Procedure Laterality Date   SHOULDER SURGERY Left    3 rods in left shoulder   Social History   Occupational History   Not on file  Tobacco Use   Smoking status: Former   Smokeless tobacco: Never  Vaping Use   Vaping Use: Every day  Substance and Sexual Activity   Alcohol use: Yes   Drug use: Yes    Types: Marijuana   Sexual activity: Not on file

## 2022-09-25 ENCOUNTER — Telehealth: Payer: Self-pay | Admitting: Neurology

## 2022-09-25 NOTE — Telephone Encounter (Signed)
Pt is asking for a call to discuss if he is on driving restrictions, please call.

## 2022-10-28 ENCOUNTER — Encounter: Payer: Self-pay | Admitting: Family Medicine

## 2022-12-12 ENCOUNTER — Telehealth: Payer: Medicaid Other | Admitting: Nurse Practitioner

## 2022-12-12 DIAGNOSIS — J020 Streptococcal pharyngitis: Secondary | ICD-10-CM

## 2022-12-12 MED ORDER — AMOXICILLIN 500 MG PO CAPS: 500.0000 mg | ORAL_CAPSULE | Freq: Two times a day (BID) | ORAL | 0 refills | Status: AC

## 2022-12-12 NOTE — Progress Notes (Signed)
E-Visit for Sore Throat - Strep Symptoms  We are sorry that you are not feeling well.  Here is how we plan to help!  Based on what you have shared with me it is likely that you have strep pharyngitis.  Strep pharyngitis is inflammation and infection in the back of the throat.  This is an infection cause by bacteria and is treated with antibiotics.  I have prescribed Amoxicillin 500 mg twice a day for 10 days. For throat pain, we recommend over the counter oral pain relief medications such as acetaminophen or aspirin, or anti-inflammatory medications such as ibuprofen or naproxen sodium. Topical treatments such as oral throat lozenges or sprays may be used as needed. Strep infections are not as easily transmitted as other respiratory infections, however we still recommend that you avoid close contact with loved ones, especially the very young and elderly.  Remember to wash your hands thoroughly throughout the day as this is the number one way to prevent the spread of infection and wipe down door knobs and counters with disinfectant.   Home Care: Only take medications as instructed by your medical team. Complete the entire course of an antibiotic. Do not take these medications with alcohol. A steam or ultrasonic humidifier can help congestion.  You can place a towel over your head and breathe in the steam from hot water coming from a faucet. Avoid close contacts especially the very young and the elderly. Cover your mouth when you cough or sneeze. Always remember to wash your hands.  Get Help Right Away If: You develop worsening fever or sinus pain. You develop a severe head ache or visual changes. Your symptoms persist after you have completed your treatment plan.  Make sure you Understand these instructions. Will watch your condition. Will get help right away if you are not doing well or get worse.   Thank you for choosing an e-visit.  Your e-visit answers were reviewed by a board  certified advanced clinical practitioner to complete your personal care plan. Depending upon the condition, your plan could have included both over the counter or prescription medications.  Please review your pharmacy choice. Make sure the pharmacy is open so you can pick up prescription now. If there is a problem, you may contact your provider through MyChart messaging and have the prescription routed to another pharmacy.  Your safety is important to us. If you have drug allergies check your prescription carefully.   For the next 24 hours you can use MyChart to ask questions about today's visit, request a non-urgent call back, or ask for a work or school excuse. You will get an email in the next two days asking about your experience. I hope that your e-visit has been valuable and will speed your recovery.   Meds ordered this encounter  Medications   amoxicillin (AMOXIL) 500 MG capsule    Sig: Take 1 capsule (500 mg total) by mouth 2 (two) times daily for 10 days.    Dispense:  20 capsule    Refill:  0     I spent approximately 5 minutes reviewing the patient's history, current symptoms and coordinating their care today.   

## 2023-01-19 ENCOUNTER — Ambulatory Visit (HOSPITAL_BASED_OUTPATIENT_CLINIC_OR_DEPARTMENT_OTHER): Payer: BC Managed Care – PPO | Admitting: Family Medicine

## 2023-03-07 ENCOUNTER — Other Ambulatory Visit (HOSPITAL_BASED_OUTPATIENT_CLINIC_OR_DEPARTMENT_OTHER): Payer: Self-pay | Admitting: Family Medicine

## 2023-03-07 ENCOUNTER — Other Ambulatory Visit: Payer: Self-pay | Admitting: Neurology

## 2023-03-07 DIAGNOSIS — F3340 Major depressive disorder, recurrent, in remission, unspecified: Secondary | ICD-10-CM

## 2023-03-09 MED ORDER — LEVETIRACETAM 1000 MG PO TABS: 1000.0000 mg | ORAL_TABLET | Freq: Two times a day (BID) | ORAL | 3 refills | Status: DC

## 2023-03-09 NOTE — Telephone Encounter (Signed)
Please advise on refill request

## 2023-03-11 MED ORDER — SERTRALINE HCL 50 MG PO TABS: 50.0000 mg | ORAL_TABLET | Freq: Every day | ORAL | 0 refills | Status: DC

## 2023-04-02 ENCOUNTER — Encounter (HOSPITAL_BASED_OUTPATIENT_CLINIC_OR_DEPARTMENT_OTHER): Payer: Self-pay | Admitting: Family Medicine

## 2023-04-03 ENCOUNTER — Other Ambulatory Visit (HOSPITAL_BASED_OUTPATIENT_CLINIC_OR_DEPARTMENT_OTHER): Payer: Self-pay

## 2023-04-03 MED ORDER — LEVETIRACETAM 1000 MG PO TABS: 1000.0000 mg | ORAL_TABLET | Freq: Two times a day (BID) | ORAL | 0 refills | Status: DC

## 2023-04-06 ENCOUNTER — Telehealth (HOSPITAL_BASED_OUTPATIENT_CLINIC_OR_DEPARTMENT_OTHER): Payer: Self-pay | Admitting: Family Medicine

## 2023-04-06 NOTE — Telephone Encounter (Signed)
Pt called access nurse to advised he tested positive for covid -I called pt LVM for him to call for an appt

## 2023-07-07 ENCOUNTER — Other Ambulatory Visit (HOSPITAL_BASED_OUTPATIENT_CLINIC_OR_DEPARTMENT_OTHER): Payer: Self-pay | Admitting: Family Medicine

## 2023-07-07 DIAGNOSIS — F3340 Major depressive disorder, recurrent, in remission, unspecified: Secondary | ICD-10-CM

## 2023-07-09 NOTE — Telephone Encounter (Signed)
Pt scheduled for follow up

## 2023-07-13 ENCOUNTER — Ambulatory Visit (INDEPENDENT_AMBULATORY_CARE_PROVIDER_SITE_OTHER): Payer: 59 | Admitting: Family Medicine

## 2023-07-13 ENCOUNTER — Encounter (HOSPITAL_BASED_OUTPATIENT_CLINIC_OR_DEPARTMENT_OTHER): Payer: Self-pay | Admitting: Family Medicine

## 2023-07-13 DIAGNOSIS — R7303 Prediabetes: Secondary | ICD-10-CM

## 2023-07-13 DIAGNOSIS — Z0000 Encounter for general adult medical examination without abnormal findings: Secondary | ICD-10-CM

## 2023-07-13 DIAGNOSIS — F3340 Major depressive disorder, recurrent, in remission, unspecified: Secondary | ICD-10-CM | POA: Diagnosis not present

## 2023-07-13 DIAGNOSIS — Z720 Tobacco use: Secondary | ICD-10-CM

## 2023-07-13 MED ORDER — SERTRALINE HCL 50 MG PO TABS: 50.0000 mg | ORAL_TABLET | Freq: Every day | ORAL | 1 refills | Status: DC

## 2023-07-13 MED ORDER — NICOTINE POLACRILEX 4 MG MT LOZG: 4.0000 mg | LOZENGE | OROMUCOSAL | 1 refills | Status: AC | PRN

## 2023-07-13 NOTE — Progress Notes (Signed)
    Procedures performed today:    None.  Independent interpretation of notes and tests performed by another provider:   None.  Brief History, Exam, Impression, and Recommendations:    BP 124/88 (BP Location: Right Arm, Patient Position: Sitting, Cuff Size: Normal)   Pulse 85   Ht 6\' 3"  (1.905 m)   Wt 192 lb 12.8 oz (87.5 kg)   SpO2 98%   BMI 24.10 kg/m   Wellness examination -     CBC with Differential/Platelet; Future -     Comprehensive metabolic panel; Future -     Hemoglobin A1c; Future -     Lipid panel; Future -     TSH Rfx on Abnormal to Free T4; Future  Tobacco use Assessment & Plan: Patient is interested in quitting at this time.  We reviewed options in this regard.  After discussion, patient elected to proceed with nicotine replacement therapy with use of nicotine lozenge.  Given that he does typically smoke is for cigarette within 30 minutes of waking up, will proceed with 4 mg dose.  Instructed on proper use. Will plan to follow-up closely to assess progress with intervention and provide further assistance as needed  Orders: -     Nicotine Polacrilex; Take 1 lozenge (4 mg total) by mouth as needed for smoking cessation.  Dispense: 100 tablet; Refill: 1  Recurrent major depressive disorder, in remission [facility]) Assessment & Plan: Patient reports that he lost his father earlier this year.  As a result, patient did start retaking his sertraline.  For period of time, he had stopped taking medication.  He indicates that he has been doing well since he restarted medication.  Denies any issues with medication.  Not currently involved in counseling, however reports that he will be looking to start with new counselor.  Offered referral if desired, patient will let us know if he would like [place] to assist with this.  Orders: -     Sertraline HCl; Take 1 tablet (50 mg total) by mouth daily.  Dispense: 90 tablet; Refill: 1  Prediabetes Assessment & Plan: Last hemoglobin A1c  was within prediabetes range, had been stable at that time.  He is due for recheck, we will plan to recheck with upcoming baseline labs for physical.  Reviewed recommended lifestyle modifications.   Return in about 2 months (around 09/12/2023) for CPE with fasting labs 1 week prior.  Spent 32 minutes on this patient encounter, including preparation, chart review, face-to-face counseling with patient and coordination of care, and documentation of encounter   ___________________________________________ Maguire Sime de [place], MD, ABFM, [facility] Primary Care and Sports Medicine [facility]

## 2023-07-13 NOTE — Assessment & Plan Note (Signed)
Last hemoglobin A1c was within prediabetes range, had been stable at that time.  He is due for recheck, we will plan to recheck with upcoming baseline labs for physical.  Reviewed recommended lifestyle modifications.

## 2023-07-13 NOTE — Assessment & Plan Note (Signed)
Patient reports that he lost his father earlier this year.  As a result, patient did start retaking his sertraline.  For period of time, he had stopped taking medication.  He indicates that he has been doing well since he restarted medication.  Denies any issues with medication.  Not currently involved in counseling, however reports that he will be looking to start with new counselor.  Offered referral if desired, patient will let us know if he would like [place] to assist with this.

## 2023-07-13 NOTE — Assessment & Plan Note (Signed)
Patient is interested in quitting at this time.  We reviewed options in this regard.  After discussion, patient elected to proceed with nicotine replacement therapy with use of nicotine lozenge.  Given that he does typically smoke is for cigarette within 30 minutes of waking up, will proceed with 4 mg dose.  Instructed on proper use. Will plan to follow-up closely to assess progress with intervention and provide further assistance as needed

## 2023-08-10 DIAGNOSIS — Z8673 Personal history of transient ischemic attack (TIA), and cerebral infarction without residual deficits: Secondary | ICD-10-CM | POA: Diagnosis not present

## 2023-08-10 DIAGNOSIS — Z823 Family history of stroke: Secondary | ICD-10-CM | POA: Diagnosis not present

## 2023-08-10 DIAGNOSIS — Z833 Family history of diabetes mellitus: Secondary | ICD-10-CM | POA: Diagnosis not present

## 2023-08-10 DIAGNOSIS — F32A Depression, unspecified: Secondary | ICD-10-CM | POA: Diagnosis not present

## 2023-08-10 DIAGNOSIS — Z8249 Family history of ischemic heart disease and other diseases of the circulatory system: Secondary | ICD-10-CM | POA: Diagnosis not present

## 2023-08-10 DIAGNOSIS — G40909 Epilepsy, unspecified, not intractable, without status epilepticus: Secondary | ICD-10-CM | POA: Diagnosis not present

## 2023-08-10 DIAGNOSIS — Z91199 Patient's noncompliance with other medical treatment and regimen due to unspecified reason: Secondary | ICD-10-CM | POA: Diagnosis not present

## 2023-08-10 DIAGNOSIS — Z809 Family history of malignant neoplasm, unspecified: Secondary | ICD-10-CM | POA: Diagnosis not present

## 2023-08-10 DIAGNOSIS — Z720 Tobacco use: Secondary | ICD-10-CM | POA: Diagnosis not present

## 2023-09-16 ENCOUNTER — Encounter (HOSPITAL_BASED_OUTPATIENT_CLINIC_OR_DEPARTMENT_OTHER): Payer: 59 | Admitting: Family Medicine

## 2023-10-20 ENCOUNTER — Ambulatory Visit (INDEPENDENT_AMBULATORY_CARE_PROVIDER_SITE_OTHER): Payer: Medicaid Other | Admitting: Family Medicine

## 2023-10-20 ENCOUNTER — Encounter (HOSPITAL_BASED_OUTPATIENT_CLINIC_OR_DEPARTMENT_OTHER): Payer: Self-pay | Admitting: Family Medicine

## 2023-10-20 VITALS — BP 120/89 | HR 55 | Ht 75.0 in | Wt 183.5 lb

## 2023-10-20 DIAGNOSIS — K5190 Ulcerative colitis, unspecified, without complications: Secondary | ICD-10-CM

## 2023-10-20 DIAGNOSIS — Z0000 Encounter for general adult medical examination without abnormal findings: Secondary | ICD-10-CM

## 2023-10-20 NOTE — Patient Instructions (Signed)
Medication Instructions:  Your physician recommends that you continue on your current medications as directed. Please refer to the Current Medication list given to you today. --If you need a refill on any your medications before your next appointment, please call your pharmacy first. If no refills are authorized on file call the office.-- Lab Work: Your physician has recommended that you have lab work today: today If you have labs (blood work) drawn today and your tests are completely normal, you will receive your results via MyChart message OR a phone call from our staff.  Please ensure you check your voicemail in the event that you authorized detailed messages to be left on a delegated number. If you have any lab test that is abnormal or we need to change your treatment, we will call you to review the results.    Follow-Up: Your next appointment:   Your physician recommends that you schedule a follow-up appointment in: 6 month follow up with Dr. de [place]  You will receive a text message or e-mail with a link to a survey about your care and experience with [place] today! We would greatly appreciate your feedback!   Thanks for letting us be apart of your health journey!!  Primary Care and Sports Medicine   Dr. Ceasar Mons [place]   We encourage you to activate your patient portal called "MyChart".  Sign up information is provided on this After Visit Summary.  MyChart is used to connect with patients for Virtual Visits (Telemedicine).  Patients are able to view lab/test results, encounter notes, upcoming appointments, etc.  Non-urgent messages can be sent to your provider as well. To learn more about what you can do with MyChart, please visit --  [email or web address].

## 2023-10-20 NOTE — Assessment & Plan Note (Signed)
Routine HCM labs ordered. HCM reviewed/discussed. Anticipatory guidance regarding healthy weight, lifestyle and choices given. Recommend healthy diet.  Recommend approximately 150 minutes/week of moderate intensity exercise Recommend regular dental and vision exams Always use seatbelt/lap and shoulder restraints Recommend using smoke alarms and checking batteries at least twice a year Recommend using sunscreen when outside Discussed tetanus immunization recommendations, patient is UTD

## 2023-10-20 NOTE — Assessment & Plan Note (Signed)
History of UC with colectomy about 25 years ago We will refer patient to GI so that he may establish with a provider locally

## 2023-10-20 NOTE — Progress Notes (Signed)
Subjective:    CC: Annual Physical Exam  HPI:  Mark Waters is a 38 y.o. presenting for annual physical  I reviewed the past medical history, family history, social history, surgical history, and allergies today and no changes were needed.  Please see the problem list section below in epic for further details.  Past Medical History: Past Medical History:  Diagnosis Date   Seizures (H[facility])    Stroke ([facility])    Ulcerative colitis ([facility])    Past Surgical History: Past Surgical History:  Procedure Laterality Date   SHOULDER SURGERY Left    3 rods in left shoulder   Social History: Social History   Socioeconomic History   Marital status: Single    Spouse name: Not on file   Number of children: Not on file   Years of education: Not on file   Highest education level: Associate degree: occupational, Scientist, product/process development, or vocational program  Occupational History   Not on file  Tobacco Use   Smoking status: Every Day    Types: Cigarettes    Passive exposure: Current   Smokeless tobacco: Never  Vaping Use   Vaping status: Former  Substance and Sexual Activity   Alcohol use: Yes   Drug use: Yes    Types: Marijuana   Sexual activity: Not on file  Other Topics Concern   Not on file  Social History Narrative   Not on file   Social Drivers of Health   Financial Resource Strain: High Risk (07/10/2023)   Overall Financial Resource Strain (CARDIA)    Difficulty of Paying Living Expenses: Very hard  Food Insecurity: Food Insecurity Present (07/10/2023)   Hunger Vital Sign    Worried About Running Out of Food in the Last Year: Often true    Ran Out of Food in the Last Year: Often true  Transportation Needs: No Transportation Needs (07/10/2023)   PRAPARE - Administrator, Civil Service (Medical): No    Lack of Transportation (Non-Medical): No  Physical Activity: Insufficiently Active (07/10/2023)   Exercise Vital Sign    Days of Exercise per Week: 5 days    Minutes of Exercise  per Session: 20 min  Stress: Stress Concern Present (07/10/2023)   [employer] of Occupational Health - Occupational Stress Questionnaire    Feeling of Stress : To some extent  Social Connections: Socially Isolated (07/10/2023)   Social Connection and Isolation Panel [NHANES]    Frequency of Communication with Friends and Family: Once a week    Frequency of Social Gatherings with Friends and Family: Once a week    Attends Religious Services: 1 to 4 times per year    Active Member of [employer] or Organizations: No    Attends Engineer, structural: Not on file    Marital Status: Never married   Family History: History reviewed. No pertinent family history. Allergies: No Known Allergies Medications: See med rec.  Review of Systems: No headache, visual changes, nausea, vomiting, diarrhea, constipation, dizziness, abdominal pain, skin rash, fevers, chills, night sweats, swollen lymph nodes, weight loss, chest pain, body aches, joint swelling, muscle aches, shortness of breath, mood changes, visual or auditory hallucinations.  Objective:    BP 120/89 (BP Location: Left Arm, Patient Position: Sitting, Cuff Size: Normal)   Pulse (!) 55   Ht 6\' 3"  (1.905 m)   Wt 183 lb 8 oz (83.2 kg)   SpO2 100%   BMI 22.94 kg/m   General: Well Developed, well nourished, and in no  acute distress.  Neuro: Alert and oriented x3, extra-ocular muscles intact, sensation grossly intact. Cranial nerves II through XII are intact, motor, sensory, and coordinative functions are all intact. HEENT: Normocephalic, atraumatic, pupils equal round reactive to light, neck supple, no masses, no lymphadenopathy, thyroid nonpalpable. Oropharynx, nasopharynx, external ear canals are unremarkable. Skin: Warm and dry, no rashes noted.  Cardiac: Regular rate and rhythm, no murmurs rubs or gallops.  Respiratory: Clear to auscultation bilaterally. Not using accessory muscles, speaking in full sentences.  Abdominal: Soft,  nontender, nondistended, positive bowel sounds, no masses, no organomegaly.  Musculoskeletal: Shoulder, elbow, wrist, hip, knee, ankle stable, and with full range of motion.  Impression and Recommendations:    Wellness examination Assessment & Plan: Routine HCM labs ordered. HCM reviewed/discussed. Anticipatory guidance regarding healthy weight, lifestyle and choices given. Recommend healthy diet.  Recommend approximately 150 minutes/week of moderate intensity exercise Recommend regular dental and vision exams Always use seatbelt/lap and shoulder restraints Recommend using smoke alarms and checking batteries at least twice a year Recommend using sunscreen when outside Discussed tetanus immunization recommendations, patient is UTD   Ulcerative colitis without complications, unspecified location [facility]) Assessment & Plan: History of UC with colectomy about 25 years ago We will refer patient to GI so that he may establish with a provider locally  Orders: -     Ambulatory referral to Gastroenterology  Patient did have recent visit to review his emergency department due to acute episode of paralysis, suspected to be related to seizure.  He has not had recent follow-up with neurology and knows that with recent seizure he does need to arrange for follow-up.  He plans to reach out to his neurology office to schedule this.  He is aware of [place] driving laws and to not drive for at least 6 months  Return in about 6 months (around 04/18/2024).   ___________________________________________ Taevin Mcferran de [place], MD, ABFM, CAQSM Primary Care and Sports Medicine [facility]

## 2023-10-21 ENCOUNTER — Encounter (HOSPITAL_BASED_OUTPATIENT_CLINIC_OR_DEPARTMENT_OTHER): Payer: Self-pay | Admitting: Family Medicine

## 2023-10-21 LAB — HEMOGLOBIN A1C
Est. average glucose Bld gHb Est-mCnc: 131 mg/dL
Hgb A1c MFr Bld: 6.2 % — ABNORMAL HIGH (ref 4.8–5.6)

## 2023-10-21 LAB — CBC WITH DIFFERENTIAL/PLATELET
Basophils Absolute: 0 10*3/uL (ref 0.0–0.2)
Basos: 0 %
EOS (ABSOLUTE): 0.1 10*3/uL (ref 0.0–0.4)
Eos: 2 %
Hematocrit: 43 % (ref 37.5–51.0)
Hemoglobin: 14.6 g/dL (ref 13.0–17.7)
Immature Grans (Abs): 0 10*3/uL (ref 0.0–0.1)
Immature Granulocytes: 0 %
Lymphocytes Absolute: 1.2 10*3/uL (ref 0.7–3.1)
Lymphs: 26 %
MCH: 30.6 pg (ref 26.6–33.0)
MCHC: 34 g/dL (ref 31.5–35.7)
MCV: 90 fL (ref 79–97)
Monocytes Absolute: 0.3 10*3/uL (ref 0.1–0.9)
Monocytes: 6 %
Neutrophils Absolute: 3 10*3/uL (ref 1.4–7.0)
Neutrophils: 66 %
Platelets: 248 10*3/uL (ref 150–450)
RBC: 4.77 x10E6/uL (ref 4.14–5.80)
RDW: 12.5 % (ref 11.6–15.4)
WBC: 4.6 10*3/uL (ref 3.4–10.8)

## 2023-10-21 LAB — LIPID PANEL
Chol/HDL Ratio: 3.4 {ratio} (ref 0.0–5.0)
Cholesterol, Total: 285 mg/dL — ABNORMAL HIGH (ref 100–199)
HDL: 84 mg/dL (ref >39)
LDL Chol Calc (NIH): 185 mg/dL — ABNORMAL HIGH (ref 0–99)
Triglycerides: 94 mg/dL (ref 0–149)
VLDL Cholesterol Cal: 16 mg/dL (ref 5–40)

## 2023-10-21 LAB — COMPREHENSIVE METABOLIC PANEL
ALT: 26 [IU]/L (ref 0–44)
AST: 28 [IU]/L (ref 0–40)
Albumin: 5 g/dL (ref 4.1–5.1)
Alkaline Phosphatase: 95 [IU]/L (ref 44–121)
BUN/Creatinine Ratio: 13 (ref 9–20)
BUN: 11 mg/dL (ref 6–20)
Bilirubin Total: 0.3 mg/dL (ref 0.0–1.2)
CO2: 24 mmol/L (ref 20–29)
Calcium: 9.8 mg/dL (ref 8.7–10.2)
Chloride: 100 mmol/L (ref 96–106)
Creatinine, Ser: 0.84 mg/dL (ref 0.76–1.27)
Globulin, Total: 2.5 g/dL (ref 1.5–4.5)
Glucose: 97 mg/dL (ref 70–99)
Potassium: 4.7 mmol/L (ref 3.5–5.2)
Sodium: 138 mmol/L (ref 134–144)
Total Protein: 7.5 g/dL (ref 6.0–8.5)
eGFR: 115 mL/min/{1.73_m2} (ref >59)

## 2023-10-21 LAB — TSH RFX ON ABNORMAL TO FREE T4: TSH: 1.15 u[IU]/mL (ref 0.450–4.500)

## 2023-12-31 ENCOUNTER — Encounter: Payer: Self-pay | Admitting: Gastroenterology

## 2024-02-05 ENCOUNTER — Other Ambulatory Visit: Payer: Self-pay

## 2024-02-05 ENCOUNTER — Ambulatory Visit
Admission: RE | Admit: 2024-02-05 | Discharge: 2024-02-05 | Disposition: A | Discharge status: Home / Self Care | Source: Ambulatory Visit | Attending: Physician Assistant | Admitting: Physician Assistant

## 2024-02-05 ENCOUNTER — Other Ambulatory Visit (HOSPITAL_COMMUNITY): Payer: Self-pay

## 2024-02-05 VITALS — BP 132/85 | HR 108 | Temp 98.3°F | Resp 20 | Ht 75.0 in | Wt 183.4 lb

## 2024-02-05 DIAGNOSIS — S46811A Strain of other muscles, fascia and tendons at shoulder and upper arm level, right arm, initial encounter: Secondary | ICD-10-CM | POA: Diagnosis not present

## 2024-02-05 MED ORDER — METHOCARBAMOL 500 MG PO TABS
500.0000 mg | ORAL_TABLET | Freq: Three times a day (TID) | ORAL | 0 refills | Status: DC | PRN
  Filled 2024-02-05: qty 20, 7d supply, fill #0

## 2024-02-05 MED ORDER — PREDNISONE 20 MG PO TABS
ORAL_TABLET | ORAL | 0 refills | Status: DC
  Filled 2024-02-05: qty 13, 7d supply, fill #0

## 2024-02-05 NOTE — ED Triage Notes (Signed)
 Pt presents with complaints of right mid-upper back pain that is now radiating to the right shoulder and right neck area. Pt currently rates his overall pain an 8/10. OTC Ibuprofen dual-action taken with some relief. No medications taken today for pain. Denies any injuries. Pain has been ongoing since 12/27/23.

## 2024-02-05 NOTE — Discharge Instructions (Addendum)
 Based on your symptoms and physical exam I believe the following is the cause of your concern today Back pain likely secondary to a strain of your back muscles  I recommend the following at this time to help relieve that discomfort:  Rest Warm compresses to the area (20 minutes on, minimum of 30 minutes off) You can alternate Tylenol  and Ibuprofen for pain management but Ibuprofen is typically preferred to reduce inflammation.  Gentle stretches and exercises that I have included in your paperwork Try to reduce excess strain to the area and rest as much as possible  Wear supportive shoes and, if you must lift anything, use proper lifting techniques that spare your back.   I have sent in a script for Prednisone  taper to be taken in the morning with breakfast per the instructions on the container Remember that steroids can cause sleeplessness, irritability, increased hunger and elevated glucose levels so be mindful of these side effects. They should lessen as you progress to the lower doses of the taper. I have sent in a script for a muscle relaxer called Robaxin . These medications can make you drowsy or sedated so please do not take if you need to remain alert or drive. Please do not take with other sedative substances such as benadryl, alcohol, etc.   [facility] Dines [address], [place], [place] [address]  [phone]   [facility] [address], [address], [place], [place] [address] [phone]

## 2024-02-05 NOTE — ED Provider Notes (Signed)
 [facility]    CSN: [id number] Arrival date & time: 02/05/24  1500      History   Chief Complaint Chief Complaint  Patient presents with   Back Pain    Feels like a pinching and sharp pain in my back, neck and right shoulder - Entered by patient    HPI Mark Waters is a 38 y.o. male.   HPI Pt reports he has been having upper back pain that is radiating into his neck and shoulder predominantly on the right side  He reports this has been ongoing since Easter (12/27/23)  He denies known injuries or trauma to the area  Interventions: massage gun, stretches, dual action Tylenol , warm compresses  He reports he is having some tingling sensation going down his right arm   He reports pain is predominantly on the right side and he has not been able to lay on this side when sleeping    Pain level and character: 8/10, sharp, achy, tingling, and throbbing Alleviating: warm compresses Aggravating: most movements, stretches, and compresses     Past Medical History:  Diagnosis Date   Seizures (HCC)    Stroke (HCC)    Ulcerative colitis (HCC)     Patient Active Problem List   Diagnosis Date Noted   Wellness examination 10/20/2023   Elevated blood-pressure reading without diagnosis of hypertension 04/17/2022   Protrusion of lumbar intervertebral disc 02/28/2022   Prediabetes 01/23/2022   Tobacco use 01/23/2022   Current severe episode of major depressive disorder without psychotic features without prior episode [facility])    Seizure disorder (HCC) 11/28/2020   CVA (cerebral vascular accident) (HCC) 11/28/2020   Ulcerative colitis, unspecified, without complications (HCC) 06/07/2020   Recurrent major depressive disorder, in remission (HCC) 06/07/2020   Pilonidal cyst 06/07/2020   Severe alcohol use disorder (HCC) 05/04/2015   Personal history of other diseases of the digestive system 06/21/2013   Acquired absence of other specified parts of digestive tract 09/10/2011    Nicotine  dependence, unspecified, uncomplicated 09/10/2011    Past Surgical History:  Procedure Laterality Date   SHOULDER SURGERY Left    3 rods in left shoulder       Home Medications    Prior to Admission medications   Medication Sig Start Date End Date Taking? Authorizing Provider  methocarbamol  (ROBAXIN ) 500 MG tablet Take 1 tablet (500 mg total) by mouth every 8 (eight) hours as needed for muscle spasms. 02/05/24  Yes Kaybree Williams E, PA-C  predniSONE  (DELTASONE ) 20 MG tablet Take 3 tablets by mouth daily x 2 days, then 2 tablets daily x 2 days, then 1 tablet by mouth daily x 3 days 02/05/24  Yes Shannell Mikkelsen E, PA-C  ADACEL 01-07-14.5 LF-MCG/0.5 injection  07/10/23   [provider]  aspirin-acetaminophen -caffeine (EXCEDRIN MIGRAINE) 250-250-65 MG tablet Take by mouth.    [provider]  COMIRNATY syringe  07/10/23   [provider]  FLUBLOK 0.5 ML SOSY  07/10/23   [provider]  HEPLISAV-B injection  07/10/23   [provider]  levETIRAcetam  (KEPPRA ) 1000 MG tablet Take 1 tablet (1,000 mg total) by mouth 2 (two) times daily. 03/09/23 03/03/24  Dohmeier, Raoul Byes, MD  nicotine  polacrilex (COMMIT) 4 MG lozenge Take 1 lozenge (4 mg total) by mouth as needed for smoking cessation. 07/13/23   de [place], Raymond J, MD  sertraline  (ZOLOFT ) 50 MG tablet Take 1 tablet (50 mg total) by mouth daily. 07/13/23   de [place], Raymond J, MD  Family History History reviewed. No pertinent family history.  Social History Social History   Tobacco Use   Smoking status: Every Day    Types: Cigarettes    Passive exposure: Current   Smokeless tobacco: Never  Vaping Use   Vaping status: Former  Substance Use Topics   Alcohol use: Yes   Drug use: Yes    Types: Marijuana     Allergies   Patient has no known allergies.   Review of Systems Review of Systems  Musculoskeletal:  Positive for arthralgias, back pain and neck pain.     Physical Exam Triage  Vital Signs ED Triage Vitals  Encounter Vitals Group     BP 02/05/24 1523 132/85     Systolic BP Percentile --      Diastolic BP Percentile --      Pulse Rate 02/05/24 1523 (!) 108     Resp 02/05/24 1523 20     Temp 02/05/24 1523 98.3 F (36.8 C)     Temp Source 02/05/24 1523 Oral     SpO2 02/05/24 1523 96 %     Weight 02/05/24 1526 183 lb 6.8 oz (83.2 kg)     Height 02/05/24 1526 6\' 3"  (1.905 m)     Head Circumference --      Peak Flow --      Pain Score 02/05/24 1524 8     Pain Loc --      Pain Education --      Exclude from Growth Chart --    No data found.  Updated Vital Signs BP 132/85 (BP Location: Right Arm)   Pulse (!) 108   Temp 98.3 F (36.8 C) (Oral)   Resp 20   Ht 6\' 3"  (1.905 m)   Wt 183 lb 6.8 oz (83.2 kg)   SpO2 96%   BMI 22.93 kg/m   Visual Acuity Right Eye Distance:   Left Eye Distance:   Bilateral Distance:    Right Eye Near:   Left Eye Near:    Bilateral Near:     Physical Exam Vitals reviewed.  Constitutional:      General: He is awake. He is not in acute distress.    Appearance: Normal appearance. He is well-developed and well-groomed. He is not ill-appearing or toxic-appearing.  HENT:     Head: Normocephalic and atraumatic.  Pulmonary:     Effort: Pulmonary effort is normal.  Musculoskeletal:     Right shoulder: Normal. No swelling, deformity or tenderness. Normal range of motion.     Left shoulder: Normal. No swelling, deformity or tenderness. Normal range of motion.     Cervical back: Spasms and tenderness present. Decreased range of motion.     Thoracic back: Spasms and tenderness present. Normal range of motion.     Lumbar back: Normal. No swelling, tenderness or bony tenderness. Normal range of motion. Negative right straight leg raise test and negative left straight leg raise test.       Back:     Comments: ROM findings Neck: flexion limited, extension is limited, lateral flexion and rotation are limited  Shoulder: ROM is  intact with regards to flexion, extension, abduction, adduction, internal and external rotation.Pt notes pain with adduction.  Thoracic: Lateral flexion and lateral rotation are intact and symmetrical Lumbar: Extension, flexion are intact    Neurological:     General: No focal deficit present.     Mental Status: He is alert and oriented to person, place, and time.  Psychiatric:  Mood and Affect: Mood normal.        Behavior: Behavior normal. Behavior is cooperative.        Thought Content: Thought content normal.        Judgment: Judgment normal.      UC Treatments / Results  Labs (all labs ordered are listed, but only abnormal results are displayed) Labs Reviewed - No data to display  EKG   Radiology No results found.  Procedures Procedures (including critical care time)  Medications Ordered in UC Medications - No data to display  Initial Impression / Assessment and Plan / UC Course  I have reviewed the triage vital signs and the nursing notes.  Pertinent labs & imaging results that were available during my care of the patient were reviewed by me and considered in my medical decision making (see chart for details).      Final Clinical Impressions(s) / UC Diagnoses   Final diagnoses:  Strain of right trapezius muscle, initial encounter   Patient presents today with concerns of right sided neck, upper thoracic and shoulder pain which been ongoing for about a month.  He denies improvement in symptoms with home measures such as stretches, massage, over-the-counter pain relievers.  Physical exam does demonstrate mildly reduced range of motion particularly in the neck and shoulder area.  At this time I suspect a strain of the right trapezius muscle.  Will provide stretches as well as prednisone  taper and Robaxin  to assist with symptoms.  Recommend continued use of warm compresses and massage as tolerated.  If symptoms are not improving with these measures recommend  follow-up with PCP or orthopedics open schedule evaluation for physical therapy.  Patient voiced agreement understanding with recommendations.  Follow-up as needed.    Discharge Instructions      Based on your symptoms and physical exam I believe the following is the cause of your concern today Back pain likely secondary to a strain of your back muscles  I recommend the following at this time to help relieve that discomfort:  Rest Warm compresses to the area (20 minutes on, minimum of 30 minutes off) You can alternate Tylenol  and Ibuprofen for pain management but Ibuprofen is typically preferred to reduce inflammation.  Gentle stretches and exercises that I have included in your paperwork Try to reduce excess strain to the area and rest as much as possible  Wear supportive shoes and, if you must lift anything, use proper lifting techniques that spare your back.   I have sent in a script for Prednisone  taper to be taken in the morning with breakfast per the instructions on the container Remember that steroids can cause sleeplessness, irritability, increased hunger and elevated glucose levels so be mindful of these side effects. They should lessen as you progress to the lower doses of the taper. I have sent in a script for a muscle relaxer called Robaxin . These medications can make you drowsy or sedated so please do not take if you need to remain alert or drive. Please do not take with other sedative substances such as benadryl, alcohol, etc.   [facility] [address] [place], [place] [address]  [phone]   [facility] [address], [place], [place] [address] [phone]      ED Prescriptions     Medication Sig Dispense Auth. Provider   predniSONE  (DELTASONE ) 20 MG tablet Take 3 tablets by mouth daily x 2 days, then 2 tablets daily x 2 days, then 1 tablet  by mouth daily x 3 days 13 tablet Azariyah Luhrs E, PA-C   methocarbamol  (ROBAXIN ) 500 MG  tablet Take 1 tablet (500 mg total) by mouth every 8 (eight) hours as needed for muscle spasms. 20 tablet Dorothie Wah E, PA-C      PDMP not reviewed this encounter.   Christinea Brizuela, Pearla Bottom, PA-C 02/05/24 1640

## 2024-02-23 ENCOUNTER — Ambulatory Visit: Admitting: Gastroenterology

## 2024-02-23 ENCOUNTER — Encounter: Payer: Self-pay | Admitting: Gastroenterology

## 2024-02-23 ENCOUNTER — Telehealth: Payer: Self-pay

## 2024-02-23 VITALS — BP 120/70 | HR 78 | Ht 75.0 in | Wt 190.0 lb

## 2024-02-23 DIAGNOSIS — K921 Melena: Secondary | ICD-10-CM

## 2024-02-23 DIAGNOSIS — Z860100 Personal history of colon polyps, unspecified: Secondary | ICD-10-CM

## 2024-02-23 DIAGNOSIS — K51919 Ulcerative colitis, unspecified with unspecified complications: Secondary | ICD-10-CM | POA: Diagnosis not present

## 2024-02-23 DIAGNOSIS — G40909 Epilepsy, unspecified, not intractable, without status epilepticus: Secondary | ICD-10-CM

## 2024-02-23 DIAGNOSIS — I639 Cerebral infarction, unspecified: Secondary | ICD-10-CM

## 2024-02-23 NOTE — Telephone Encounter (Signed)
 Neurology clearance letter has been faxed to Dr Cassandra Cleveland, fax # [phone], phone # [phone]. Will await his response.

## 2024-02-23 NOTE — Progress Notes (Signed)
 Chief Complaint: Establish care with history of ulcerative colitis Primary GI MD: Unassigned  HPI: 38 year old male history of ulcerative colitis s/p colectomy with J pouch age 54, CVA, Seizures, presents to establish care.  - 1997: At age 27 suddenly developed severe bloody diarrhea requiring multiple hospitalizations and eventually diagnosed with severe ulcerative pan-colitis failing to respond to medical therapy - 1998: Total colectomy at age 39 with subsequent J-pouch  -History of CVA and seizures during admission in 1998 with recurrent seizures now seeing neurology here.  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 Mariana Shipper) - 01/09/2020: Labs - Hgb 13.2, ESR 2, CRP 0.2, normal CMP & vit D, calprotectin 72  - 10/2023: Normal CBC, CMP, lipase   Discussed the use of AI scribe software for clinical note transcription with the patient, who gave verbal consent to proceed.  History of Present Illness Mark Waters is a 38 year old male with a history of ulcerative colitis and colectomy who presents for evaluation of gastrointestinal symptoms and potential endoscopic evaluation.  He has a history of ulcerative colitis diagnosed in childhood, leading to a colectomy at age 35 or [age], with the formation of a J pouch. Since then, he has experienced intermittent episodes of pouchitis, with the last episode occurring over ten years ago. His last endoscopic evaluation was at age 51 or 1 at [facility] in [place] .  Since his last endoscopic evaluation, he experiences occasional bloody stools, about once every couple of months, usually noticed on tissue paper. He has bowel movements ranging from eight to fifteen times a day, generally loose. If he does not take Imodium  at night or during the day, his stools are looser. Occasionally, he has black stools, which he attributes to  dietary factors such as coffee consumption. No abdominal pain is reported.  He has a past medical history of two strokes and three seizures, which occurred during a hospitalization in 1998. His last seizure was approximately one and a half to two years ago, attributed to missing his medication. His seizures are well-controlled when he adheres to his medication regimen.  He wants to consult with a dietitian specializing in dietary management for individuals without a colon, as he is uncertain about appropriate dietary choices. He mentions that he cannot drink milk but can consume cheese.   Past Medical History:  Diagnosis Date   Chronic headaches    since childhood   Depression    Pouchitis (HCC)    Seizures (HCC)    first in 1998, has had 3 seizures   Stroke (HCC)    x 2   Ulcerative colitis (HCC)     Past Surgical History:  Procedure Laterality Date   SHOULDER SURGERY Left    3 rods in left shoulder   TOTAL COLECTOMY     age 45, has a J pouch    Current Outpatient Medications  Medication Sig Dispense Refill   aspirin-acetaminophen -caffeine (EXCEDRIN MIGRAINE) 250-250-65 MG tablet Take by mouth.     levETIRAcetam  (KEPPRA ) 1000 MG tablet Take 1 tablet (1,000 mg total) by mouth 2 (two) times daily. 180 tablet 3   nicotine  polacrilex (COMMIT) 4 MG lozenge Take 1 lozenge (4 mg total) by mouth as needed for smoking cessation. 100 tablet 1   sertraline  (ZOLOFT ) 50 MG tablet Take 1 tablet (50 mg total) by mouth daily. 90  tablet 1   ADACEL 01-07-14.5 LF-MCG/0.5 injection      COMIRNATY syringe      FLUBLOK 0.5 ML SOSY      HEPLISAV-B injection      No current facility-administered medications for this visit.    Allergies as of 02/23/2024   (No Known Allergies)    History reviewed. No pertinent family history.  Social History   Socioeconomic History   Marital status: Single    Spouse name: Not on file   Number of children: 0   Years of education: Not on file   Highest  education level: Associate degree: occupational, Scientist, product/process development, or vocational program  Occupational History   Not on file  Tobacco Use   Smoking status: Every Day    Types: Cigarettes    Passive exposure: Current   Smokeless tobacco: Never  Vaping Use   Vaping status: Former  Substance and Sexual Activity   Alcohol use: Yes   Drug use: Yes    Types: Marijuana   Sexual activity: Not on file  Other Topics Concern   Not on file  Social History Narrative   Not on file   Social Drivers of Health   Financial Resource Strain: High Risk (07/10/2023)   Overall Financial Resource Strain (CARDIA)    Difficulty of Paying Living Expenses: Very hard  Food Insecurity: Food Insecurity Present (07/10/2023)   Hunger Vital Sign    Worried About Running Out of Food in the Last Year: Often true    Ran Out of Food in the Last Year: Often true  Transportation Needs: No Transportation Needs (07/10/2023)   PRAPARE - Administrator, Civil Service (Medical): No    Lack of Transportation (Non-Medical): No  Physical Activity: Insufficiently Active (07/10/2023)   Exercise Vital Sign    Days of Exercise per Week: 5 days    Minutes of Exercise per Session: 20 min  Stress: Stress Concern Present (07/10/2023)   [employer] of Occupational Health - Occupational Stress Questionnaire    Feeling of Stress : To some extent  Social Connections: Socially Isolated (07/10/2023)   Social Connection and Isolation Panel    Frequency of Communication with Friends and Family: Once a week    Frequency of Social Gatherings with Friends and Family: Once a week    Attends Religious Services: 1 to 4 times per year    Active Member of [employer] or Organizations: No    Attends Engineer, structural: Not on file    Marital Status: Never married  Intimate Partner Violence: Not At Risk (10/03/2023)   Received from [facility]   HITS    Over the last 12 months how often did your partner physically hurt you?:  Never    Over the last 12 months how often did your partner insult you or talk down to you?: Never    Over the last 12 months how often did your partner threaten you with physical harm?: Never    Over the last 12 months how often did your partner scream or curse at you?: Never    Review of Systems:    Constitutional: No weight loss, fever, chills, weakness or fatigue HEENT: Eyes: No change in vision               Ears, Nose, Throat:  No change in hearing or congestion Skin: No rash or itching Cardiovascular: No chest pain, chest pressure or palpitations   Respiratory: No SOB or cough Gastrointestinal: See HPI and  otherwise negative Genitourinary: No dysuria or change in urinary frequency Neurological: No headache, dizziness or syncope Musculoskeletal: No new muscle or joint pain Hematologic: No bleeding or bruising Psychiatric: No history of depression or anxiety    Physical Exam:  Vital signs: BP 120/70   Pulse 78   Ht 6' 3 (1.905 m)   Wt 190 lb (86.2 kg)   BMI 23.75 kg/m   Constitutional: NAD, alert and cooperative Head:  Normocephalic and atraumatic. Eyes:   PEERL, EOMI. No icterus. Conjunctiva pink. Respiratory: Respirations even and unlabored. Lungs clear to auscultation bilaterally.   No wheezes, crackles, or rhonchi.  Cardiovascular:  Regular rate and rhythm. No peripheral edema, cyanosis or pallor.  Gastrointestinal:  Soft, nondistended, nontender. No rebound or guarding. Normal bowel sounds. No appreciable masses or hepatomegaly. Rectal:  Declines Msk:  Symmetrical without gross deformities. Without edema, no deformity or joint abnormality.  Neurologic:  Alert and  oriented x4;  grossly normal neurologically.  Skin:   Dry and intact without significant lesions or rashes. Psychiatric: Oriented to person, place and time. Demonstrates good judgement and reason without abnormal affect or behaviors.   RELEVANT LABS AND IMAGING: CBC    Component Value Date/Time    WBC 4.6 10/20/2023 1412   WBC 3.2 (L) 08/20/2020 1533   RBC 4.77 10/20/2023 1412   RBC 4.92 08/20/2020 1533   HGB 14.6 10/20/2023 1412   HCT 43.0 10/20/2023 1412   PLT 248 10/20/2023 1412   MCV 90 10/20/2023 1412   MCH 30.6 10/20/2023 1412   MCH 31.1 08/20/2020 1533   MCHC 34.0 10/20/2023 1412   MCHC 35.0 08/20/2020 1533   RDW 12.5 10/20/2023 1412   LYMPHSABS 1.2 10/20/2023 1412   MONOABS 0.2 08/20/2020 1533   EOSABS 0.1 10/20/2023 1412   BASOSABS 0.0 10/20/2023 1412    CMP     Component Value Date/Time   NA 138 10/20/2023 1412   K 4.7 10/20/2023 1412   CL 100 10/20/2023 1412   CO2 24 10/20/2023 1412   GLUCOSE 97 10/20/2023 1412   GLUCOSE 94 08/20/2020 1533   BUN 11 10/20/2023 1412   CREATININE 0.84 10/20/2023 1412   CALCIUM 9.8 10/20/2023 1412   PROT 7.5 10/20/2023 1412   ALBUMIN 5.0 10/20/2023 1412   AST 28 10/20/2023 1412   ALT 26 10/20/2023 1412   ALKPHOS 95 10/20/2023 1412   BILITOT 0.3 10/20/2023 1412   GFRNONAA >60 08/20/2020 1533     Assessment/Plan:   38 year old pleasant male history of ulcerative colitis s/p colectomy with J-pouch age 69, CVA 1998, seizures (last one 1.5 years ago) following Dr. Samara Crest, presents to establish care  Ulcerative Colitis s/p colectomy with J pouch age 46 Last endoscopic evaluation reportedly in 1998.  3 episodes of pouchitis since then treated with antibiotics.  Unknown previous meds prior to surgery. 6-15 bowel movements per day managed on Imodium  4 tablets, this has been chronic since his surgery.  No pain.  Occasional rectal bleeding on wiping once every 6 months.  Reported personal history of colon polyps.  Occasional melena dependent on what he eats. No records.  - Obtain previous records from [facility] if possible - Patient ultimately needs pouchoscopy for further evaluation - Can increase Imodium  to 6 tablets if necessary - Referral to dietitian per patient request - I thoroughly discussed the procedure with the patient  (at bedside) to include nature of the procedure, alternatives, benefits, and risks (including but not limited to bleeding, infection, perforation, anesthesia/cardiac pulmonary complications).  Patient verbalized understanding and gave verbal consent to proceed with procedure.  Melena Longstanding history of intermittent melena depending on what he eats for many years.  CBC without anemia.  Likely could be just correlated what he eats but cannot rule out upper GI etiology with no previous endoscopy. - EGD - I thoroughly discussed the procedure with the patient (at bedside) to include nature of the procedure, alternatives, benefits, and risks (including but not limited to bleeding, infection, perforation, anesthesia/cardiac pulmonary complications).  Patient verbalized understanding and gave verbal consent to proceed with procedure.   General health maintenance for IBD:  - Sunscreen and skin protection with annual skin exam by dermatologist given the increased risk of skin cancer. - Calcium and vitamin D supplementation with monitoring and screening for osteoporosis. - Updating immunizations including annual influenza. Pharmacist will be contacting patient to review and update. - Smoking/nicotine  cessation or avoidance of second-hand exposure. - Annual eye exam   Seizures History of seizures with last seizure being 1.5 years ago currently well-controlled on medication - Obtain neurology clearance - Send to Rogena Class, CRNA to ensure he is an [facility] candidate  Assigned to Dr. Anastasia Balo, PA-C [facility] 02/23/2024, 3:41 PM  Cc: de Peru, Raymond J, MD

## 2024-02-23 NOTE — Patient Instructions (Addendum)
 Nice to meet you today.  You have been scheduled for a pouchoscopy and EGD.  Please follow written instructions given to you at your visit today.   If you use inhalers (even only as needed), please bring them with you on the day of your procedure.  We will try and obtain your [facility] records.    We will send for neurology clearance from Dr Cassandra Cleveland .   _______________________________________________________  If your blood pressure at your visit was 140/90 or greater, please contact your primary care physician to follow up on this.  _______________________________________________________  If you are age 15 or older, your body mass index should be between 23-30. Your Body mass index is 23.75 kg/m. If this is out of the aforementioned range listed, please consider follow up with your Primary Care Provider.  If you are age 10 or younger, your body mass index should be between 19-25. Your Body mass index is 23.75 kg/m. If this is out of the aformentioned range listed, please consider follow up with your Primary Care Provider.   ________________________________________________________  The [facility] providers would like to encourage you to use MYCHART to communicate with providers for non-urgent requests or questions.  Due to long hold times on the telephone, sending your provider a message by [facility] may be a faster and more efficient way to get a response.  Please allow 48 business hours for a response.  Please remember that this is for non-urgent requests.  _______________________________________________________  I appreciate the opportunity to care for you. Suzanna Erp, PA ___________________________________________________________________________

## 2024-03-07 NOTE — Telephone Encounter (Signed)
 I have called and left Theo a message that I see he has a [facility] message from Dr Janean office to call them and get an appointment so we can get neurology clearance for his upcoming procedures.

## 2024-03-10 NOTE — Telephone Encounter (Signed)
 Pt has called to speak with CMA re: my chart message he received from her

## 2024-03-14 NOTE — Telephone Encounter (Signed)
 Mark Waters has an appointment 03/15/2024 with neurology office to get clearance.

## 2024-03-15 ENCOUNTER — Ambulatory Visit: Admitting: Neurology

## 2024-03-15 ENCOUNTER — Encounter: Payer: Self-pay | Admitting: Neurology

## 2024-03-15 VITALS — BP 135/90 | HR 61 | Resp 15 | Ht 75.0 in | Wt 193.0 lb

## 2024-03-15 DIAGNOSIS — G40209 Localization-related (focal) (partial) symptomatic epilepsy and epileptic syndromes with complex partial seizures, not intractable, without status epilepticus: Secondary | ICD-10-CM

## 2024-03-15 NOTE — Progress Notes (Unsigned)
 [facility]  PATIENT: Mark Waters DOB: 18-Sep-1985  REQUESTING CLINICIAN: de [place], Raymond J, MD HISTORY FROM: Patient  REASON FOR VISIT: Seizure disorder    HISTORICAL  CHIEF COMPLAINT:  Chief Complaint  Patient presents with   Pre-op Exam    RM11, alone, PT IS here to be evaluated for surgical clearance. Explained to pt that we will need new surgical clearance form faxed to us  from surgeon. Pt is having pouchoscopy and endoscopy on 04/07/24 .    Seizures    Rm 11 alone, pt stated that their last sz was 2 years ago and that they are well controlled    INTERVAL HISTORY 03/15/2024:  Patient presents today for follow-up, last visit was in June 2023.  At that time we continued him on Keppra  1000 mg twice daily.  He denies any seizure or seizure like activity, compliant with his medication.  He is presenting today because he needs a surgical clearance for procedure under general anesthesia scheduled at the end month.  No other complaints, no other concerns, again he is compliant with his medications, no side effect and no seizures.    HISTORY OF PRESENT ILLNESS:  This is a 38 year old gentleman past medical history of ulcerative colitis, previous stroke and seizure in 1998 who is presenting to establish care.  Patient reported in 1998 he had a bout of ulcerative colitis infection, and from there he developed clots and had a left hemispheric stroke.  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  Initially he was put on Keppra , did well and was seizure-free until 2018 when he had recurrent seizure.  At that time, his neurologist told him that the seizure might have been provoked by alcohol.  He was put back on Keppra  500 mg twice daily but have recurrent seizures therefore he was increased to 1000 mg twice daily.  Since then he has been doing well but will have breakthrough seizure in the setting of missing his dose of medication, the last  seizure was 2 weeks ago and this is in the setting of missing his Keppra  dose but prior to that it was 2 years ago.   With his seizure, he reports that he can tell before the seizure start, usually he does not feel right, he has an abnormal sensation that he cannot describe and sometimes he will have vision changes.  He will feel burning sensation with his right arm and sometimes he will have right hand automatisms prior to losing consciousness and passing out.  With the seizure he has bruises and tongue biting but no broken bones.    Handedness: Right handed   Onset: 1998   Seizure Type: Focal to bilateral   Current frequency: Last seizure 2 weeks but prior to that 3 years ago   Any injuries from seizures: None   Seizure risk factors: Prior Strokes   Previous ASMs: Levetiracetam    Currenty ASMs: Levetiracetam  1000 mg BID   ASMs side effects: None   Brain Images: Old left MCA stroke  Previous EEGs: Not previously done    OTHER MEDICAL CONDITIONS: Left hemispheric stroke, epilepsy   REVIEW OF SYSTEMS: Full 14 system review of systems performed and negative with exception of: as noted in the HPI   ALLERGIES: No Known Allergies  HOME MEDICATIONS: Outpatient Medications Prior to Visit  Medication Sig Dispense Refill   ADACEL 01-07-14.5 LF-MCG/0.5 injection      aspirin-acetaminophen -caffeine (EXCEDRIN MIGRAINE) 250-250-65 MG tablet  Take by mouth.     COMIRNATY syringe      FLUBLOK 0.5 ML SOSY      HEPLISAV-B injection      levETIRAcetam  (KEPPRA ) 1000 MG tablet Take 1 tablet (1,000 mg total) by mouth 2 (two) times daily. 180 tablet 3   nicotine  polacrilex (COMMIT) 4 MG lozenge Take 1 lozenge (4 mg total) by mouth as needed for smoking cessation. 100 tablet 1   sertraline  (ZOLOFT ) 50 MG tablet Take 1 tablet (50 mg total) by mouth daily. 90 tablet 1   No facility-administered medications prior to visit.    PAST MEDICAL HISTORY: Past Medical History:  Diagnosis Date    Chronic headaches    since childhood   Depression    Pouchitis (HCC)    Seizures (HCC)    first in 1998, has had 3 seizures   Stroke (HCC)    x 2   Ulcerative colitis (HCC)     PAST SURGICAL HISTORY: Past Surgical History:  Procedure Laterality Date   SHOULDER SURGERY Left    3 rods in left shoulder   TOTAL COLECTOMY     age 13, has a J pouch    FAMILY HISTORY: History reviewed. No pertinent family history.  SOCIAL HISTORY: Social History   Socioeconomic History   Marital status: Single    Spouse name: Not on file   Number of children: 0   Years of education: Not on file   Highest education level: Associate degree: occupational, Scientist, product/process development, or vocational program  Occupational History   Not on file  Tobacco Use   Smoking status: Every Day    Types: Cigarettes    Passive exposure: Current   Smokeless tobacco: Never  Vaping Use   Vaping status: Former  Substance and Sexual Activity   Alcohol use: Yes   Drug use: Yes    Types: Marijuana   Sexual activity: Not on file  Other Topics Concern   Not on file  Social History Narrative   Not on file   Social Drivers of Health   Financial Resource Strain: High Risk (07/10/2023)   Overall Financial Resource Strain (CARDIA)    Difficulty of Paying Living Expenses: Very hard  Food Insecurity: Food Insecurity Present (07/10/2023)   Hunger Vital Sign    Worried About Running Out of Food in the Last Year: Often true    Ran Out of Food in the Last Year: Often true  Transportation Needs: No Transportation Needs (07/10/2023)   PRAPARE - Administrator, Civil Service (Medical): No    Lack of Transportation (Non-Medical): No  Physical Activity: Insufficiently Active (07/10/2023)   Exercise Vital Sign    Days of Exercise per Week: 5 days    Minutes of Exercise per Session: 20 min  Stress: Stress Concern Present (07/10/2023)   [employer] of Occupational Health - Occupational Stress Questionnaire    Feeling of  Stress : To some extent  Social Connections: Socially Isolated (07/10/2023)   Social Connection and Isolation Panel    Frequency of Communication with Friends and Family: Once a week    Frequency of Social Gatherings with Friends and Family: Once a week    Attends Religious Services: 1 to 4 times per year    Active Member of [employer] or Organizations: No    Attends Banker Meetings: Not on file    Marital Status: Never married  Intimate Partner Violence: Not At Risk (10/03/2023)   Received from [facility]  HITS    Over the last 12 months how often did your partner physically hurt you?: Never    Over the last 12 months how often did your partner insult you or talk down to you?: Never    Over the last 12 months how often did your partner threaten you with physical harm?: Never    Over the last 12 months how often did your partner scream or curse at you?: Never    PHYSICAL EXAM  GENERAL EXAM/CONSTITUTIONAL: Vitals:  Vitals:   03/15/24 1610 03/15/24 1615  BP: (!) 134/90 (!) 135/90  Pulse: 61   Resp: 15   Weight: 193 lb (87.5 kg)   Height: 6' 3 (1.905 m)    Body mass index is 24.12 kg/m. Wt Readings from Last 3 Encounters:  03/15/24 193 lb (87.5 kg)  02/23/24 190 lb (86.2 kg)  02/05/24 183 lb 6.8 oz (83.2 kg)   Patient is in no distress; well developed, nourished and groomed; neck is supple  MUSCULOSKELETAL: Gait, strength, tone, movements noted in Neurologic exam below  NEUROLOGIC: MENTAL STATUS:      No data to display         awake, alert, oriented to person, place and time recent and remote memory intact normal attention and concentration language fluent, comprehension intact, naming intact fund of knowledge appropriate  CRANIAL NERVE:  2nd, 3rd, 4th, 6th - visual fields full to confrontation, extraocular muscles intact, no nystagmus 5th - facial sensation symmetric 7th - facial strength symmetric 8th - hearing intact 9th - palate elevates  symmetrically, uvula midline 11th - shoulder shrug symmetric 12th - tongue protrusion midline  MOTOR:  normal bulk and tone, full strength in the BUE, BLE  SENSORY:  normal and symmetric to light touch  COORDINATION:  finger-nose-finger, fine finger movements normal  GAIT/STATION:  normal   DIAGNOSTIC DATA (LABS, IMAGING, TESTING) - I reviewed patient records, labs, notes, testing and imaging myself where available.  Lab Results  Component Value Date   WBC 4.6 10/20/2023   HGB 14.6 10/20/2023   HCT 43.0 10/20/2023   MCV 90 10/20/2023   PLT 248 10/20/2023      Component Value Date/Time   NA 138 10/20/2023 1412   K 4.7 10/20/2023 1412   CL 100 10/20/2023 1412   CO2 24 10/20/2023 1412   GLUCOSE 97 10/20/2023 1412   GLUCOSE 94 08/20/2020 1533   BUN 11 10/20/2023 1412   CREATININE 0.84 10/20/2023 1412   CALCIUM 9.8 10/20/2023 1412   PROT 7.5 10/20/2023 1412   ALBUMIN 5.0 10/20/2023 1412   AST 28 10/20/2023 1412   ALT 26 10/20/2023 1412   ALKPHOS 95 10/20/2023 1412   BILITOT 0.3 10/20/2023 1412   GFRNONAA >60 08/20/2020 1533   Lab Results  Component Value Date   CHOL 285 (H) 10/20/2023   HDL 84 10/20/2023   LDLCALC 185 (H) 10/20/2023   TRIG 94 10/20/2023   Lab Results  Component Value Date   HGBA1C 6.2 (H) 10/20/2023   No results found for: [id number] Lab Results  Component Value Date   TSH 1.150 10/20/2023    Head CT 12/16/2020 1. Old left MCA territory infarct without acute intracranial abnormality. 2. No acute fracture or static subluxation of the cervical spine     ASSESSMENT AND PLAN  38 y.o. year old male  with history of ulcerative colitis, old left MCA territory stroke and epilepsy who is presenting for follow-up.  He is doing well on Keppra  1000 mg  twice daily, denies any seizure or seizure like activity since his last visit in 2023.  Today he would like to be cleared for surgical procedure scheduled at the end of the month, he will be  undergoing general anesthesia.  He is currently not on any antiplatelet or anticoagulant.  From a neurological perspective, patient can proceed with surgery under general general anesthesia.  I would recommend him to take his medication on the day of the procedure.  Will send a surgical clearance note to his doctors. For his epilepsy, I will obtain a routine EEG for background pacification.  I will see him in 1 year for follow-up or sooner if worse.   1. Partial symptomatic epilepsy with complex partial seizures, not intractable, without status epilepticus [facility])      Patient Instructions  Continue with Keppra  1000 mg twice daily Continue your other medications From a neurological perspective, he may undergo surgical procedure under generalized anesthesia.  Will recommend that patient take his antiseizure medication the day of the procedure. I will send the clearance form to his doctors Will also obtain a routine EEG as background classification I will see him in 1 year for follow-up or sooner if worse     Per [place] statutes, patients with seizures are not allowed to drive until they have been seizure-free for six months.  Other recommendations include using caution when using heavy equipment or power tools. Avoid working on ladders or at heights. Take showers instead of baths.  Do not swim alone.  Ensure the water temperature is not too high on the home water heater. Do not go swimming alone. Do not lock yourself in a room alone (i.e. bathroom). When caring for infants or small children, sit down when holding, feeding, or changing them to minimize risk of injury to the child in the event you have a seizure. Maintain good sleep hygiene. Avoid alcohol.  Also recommend adequate sleep, hydration, good diet and minimize stress.   During the Seizure  - First, ensure adequate ventilation and place patients on the floor on their left side  Loosen clothing around the neck and ensure the  airway is patent. If the patient is clenching the teeth, do not force the mouth open with any object as this can cause severe damage - Remove all items from the surrounding that can be hazardous. The patient may be oblivious to what's happening and may not even know what he or she is doing. If the patient is confused and wandering, either gently guide him/her away and block access to outside areas - Reassure the individual and be comforting - Call 911. In most cases, the seizure ends before EMS arrives. However, there are cases when seizures may last over 3 to 5 minutes. Or the individual may have developed breathing difficulties or severe injuries. If a pregnant patient or a person with diabetes develops a seizure, it is prudent to call an ambulance. - Finally, if the patient does not regain full consciousness, then call EMS. Most patients will remain confused for about 45 to 90 minutes after a seizure, so you must use judgment in calling for help. - Avoid restraints but make sure the patient is in a bed with padded side rails - Place the individual in a lateral position with the neck slightly flexed; this will help the saliva drain from the mouth and prevent the tongue from falling backward - Remove all nearby furniture and other hazards from the area - Provide verbal assurance  as the individual is regaining consciousness - Provide the patient with privacy if possible - Call for help and start treatment as ordered by the caregiver   After the Seizure (Postictal Stage)  After a seizure, most patients experience confusion, fatigue, muscle pain and/or a headache. Thus, one should permit the individual to sleep. For the next few days, reassurance is essential. Being calm and helping reorient the person is also of importance.  Most seizures are painless and end spontaneously. Seizures are not harmful to others but can lead to complications such as stress on the lungs, brain and the heart. Individuals  with prior lung problems may develop labored breathing and respiratory distress.     Orders Placed This Encounter  Procedures   EEG adult    No orders of the defined types were placed in this encounter.   Return in about 1 year (around 03/15/2025).    Pastor Falling, MD 03/15/2024, 10:18 PM  [facility] [address], [address] [place], [place] [address] [phone]

## 2024-03-15 NOTE — Patient Instructions (Signed)
 Continue with Keppra  1000 mg twice daily Continue your other medications From a neurological perspective, he may undergo surgical procedure under generalized anesthesia.  Will recommend that patient take his antiseizure medication the day of the procedure. I will send the clearance form to his doctors Will also obtain a routine EEG as background classification I will see him in 1 year for follow-up or sooner if worse

## 2024-03-16 NOTE — Telephone Encounter (Signed)
 Clearance is under letters in epic and message will be routed to Dr Inocente Hausen. Mark Waters's ECL is7/31/2025 at 1pm.

## 2024-03-16 NOTE — Telephone Encounter (Signed)
 Spoke with Mark Waters and he is aware that he is cleared for his ECL.

## 2024-03-20 ENCOUNTER — Other Ambulatory Visit (HOSPITAL_BASED_OUTPATIENT_CLINIC_OR_DEPARTMENT_OTHER): Payer: Self-pay | Admitting: Family Medicine

## 2024-03-20 DIAGNOSIS — F3340 Major depressive disorder, recurrent, in remission, unspecified: Secondary | ICD-10-CM

## 2024-03-24 ENCOUNTER — Telehealth: Payer: Self-pay | Admitting: Neurology

## 2024-03-24 NOTE — Telephone Encounter (Signed)
 Invalid phone number in chart, sent mychart msg asking pt to call back to schedule EEG

## 2024-03-30 ENCOUNTER — Other Ambulatory Visit: Payer: Self-pay | Admitting: Neurology

## 2024-03-30 ENCOUNTER — Other Ambulatory Visit (HOSPITAL_COMMUNITY): Payer: Self-pay

## 2024-03-30 ENCOUNTER — Other Ambulatory Visit (HOSPITAL_BASED_OUTPATIENT_CLINIC_OR_DEPARTMENT_OTHER): Payer: Self-pay | Admitting: Family Medicine

## 2024-03-30 ENCOUNTER — Other Ambulatory Visit: Payer: Self-pay

## 2024-03-31 ENCOUNTER — Other Ambulatory Visit (HOSPITAL_COMMUNITY): Payer: Self-pay

## 2024-03-31 ENCOUNTER — Other Ambulatory Visit: Payer: Self-pay | Admitting: Family Medicine

## 2024-03-31 MED ORDER — LEVETIRACETAM 1000 MG PO TABS
1000.0000 mg | ORAL_TABLET | Freq: Two times a day (BID) | ORAL | 1 refills | Status: DC
  Filled 2024-03-31: qty 180, 90d supply, fill #0
  Filled 2024-06-01 – 2024-06-03 (×3): qty 180, 90d supply, fill #1
  Filled 2024-06-04: qty 172, 86d supply, fill #1
  Filled 2024-06-04: qty 8, 4d supply, fill #1
  Filled 2024-06-08: qty 164, 82d supply, fill #2
  Filled 2024-06-08: qty 172, 86d supply, fill #2

## 2024-04-01 ENCOUNTER — Other Ambulatory Visit (HOSPITAL_COMMUNITY): Payer: Self-pay

## 2024-04-06 NOTE — Progress Notes (Unsigned)
  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  Patient was recently seen in the GI clinic reporting melena and dark stools prompting recommendation to perform an upper endoscopy.  He has not had a history of previous upper endoscopy or upper GI pathology.  Reports being diagnosed with ulcerative colitis at age 58 and subsequently underwent TAPC+IPAA for medically refractory disease in 1998.  Has had episodes of pouchitis readily treated with antibiotics.  Reports intermittent rectal bleeding and occasional melena.  Pouchoscopy is performed for surveillance purposes.  No documented family history of colorectal cancer or polyps.   Past Medical History:  Diagnosis Date   Chronic headaches    since childhood   Depression    Pouchitis (HCC)    Seizures (HCC)    first in 1998, has had 3 seizures   Stroke (HCC)    x 2   Ulcerative colitis (HCC)     Past Surgical History:  Procedure Laterality Date   SHOULDER SURGERY Left    3 rods in left shoulder   TOTAL COLECTOMY     age 26, has a J pouch    Prior to Admission medications   Medication Sig Start Date End Date Taking? Authorizing Provider  ADACEL 01-07-14.5 LF-MCG/0.5 injection  07/10/23   [provider]  aspirin-acetaminophen -caffeine (EXCEDRIN MIGRAINE) 250-250-65 MG tablet Take by mouth.    [provider]  COMIRNATY syringe  07/10/23   [provider]  FLUBLOK 0.5 ML  SOSY  07/10/23   [provider]  HEPLISAV-B injection  07/10/23   [provider]  levETIRAcetam  (KEPPRA ) 1000 MG tablet Take 1 tablet (1,000 mg total) by mouth 2 (two) times daily. 03/31/24 03/26/25  CaudleThersia Bitters, FNP  nicotine  polacrilex (COMMIT) 4 MG lozenge Take 1 lozenge (4 mg total) by mouth as needed for smoking cessation. 07/13/23   de [place], Raymond J, MD  sertraline  (ZOLOFT ) 50 MG tablet Take 1 tablet by mouth once daily 03/21/24   de [place], Quintin PARAS, MD    Current Outpatient Medications  Medication Sig Dispense Refill   ADACEL 01-07-14.5 LF-MCG/0.5 injection      aspirin-acetaminophen -caffeine (EXCEDRIN MIGRAINE) 250-250-65 MG tablet Take by mouth.     COMIRNATY syringe      FLUBLOK 0.5 ML SOSY      HEPLISAV-B injection      levETIRAcetam  (KEPPRA ) 1000 MG tablet Take 1 tablet (1,000 mg total) by mouth 2 (two) times daily. 180 tablet 1   nicotine  polacrilex (COMMIT) 4 MG lozenge Take 1 lozenge (4 mg total) by mouth as needed for smoking cessation. 100 tablet 1   sertraline  (ZOLOFT ) 50 MG tablet Take 1 tablet by mouth once daily 90 tablet 0   No current facility-administered medications for this visit.    Allergies as of 04/07/2024   (No Known Allergies)    No family history on file.  Social History   Socioeconomic History   Marital status: Single    Spouse name: Not on file  Number of children: 0   Years of education: Not on file   Highest education level: Associate degree: occupational, technical, or vocational program  Occupational History   Not on file  Tobacco Use   Smoking status: Every Day    Types: Cigarettes    Passive exposure: Current   Smokeless tobacco: Never  Vaping Use   Vaping status: Former  Substance and Sexual Activity   Alcohol use: Yes   Drug use: Yes    Types: Marijuana   Sexual activity: Not on file  Other Topics Concern   Not on file  Social History Narrative   Not on file   Social Drivers of Health    Financial Resource Strain: High Risk (07/10/2023)   Overall Financial Resource Strain (CARDIA)    Difficulty of Paying Living Expenses: Very hard  Food Insecurity: Food Insecurity Present (07/10/2023)   Hunger Vital Sign    Worried About Running Out of Food in the Last Year: Often true    Ran Out of Food in the Last Year: Often true  Transportation Needs: No Transportation Needs (07/10/2023)   PRAPARE - Administrator, Civil Service (Medical): No    Lack of Transportation (Non-Medical): No  Physical Activity: Insufficiently Active (07/10/2023)   Exercise Vital Sign    Days of Exercise per Week: 5 days    Minutes of Exercise per Session: 20 min  Stress: Stress Concern Present (07/10/2023)   [employer] of Occupational Health - Occupational Stress Questionnaire    Feeling of Stress : To some extent  Social Connections: Socially Isolated (07/10/2023)   Social Connection and Isolation Panel    Frequency of Communication with Friends and Family: Once a week    Frequency of Social Gatherings with Friends and Family: Once a week    Attends Religious Services: 1 to 4 times per year    Active Member of [employer] or Organizations: No    Attends Engineer, structural: Not on file    Marital Status: Never married  Intimate Partner Violence: Not At Risk (10/03/2023)   Received from [facility]   HITS    Over the last 12 months how often did your partner physically hurt you?: Never    Over the last 12 months how often did your partner insult you or talk down to you?: Never    Over the last 12 months how often did your partner threaten you with physical harm?: Never    Over the last 12 months how often did your partner scream or curse at you?: Never    Review of Systems:  All other review of systems negative except as mentioned in the HPI.  Physical Exam: Vital signs There were no vitals taken for this visit.  General:   Alert,  Well-developed, well-nourished, pleasant  and cooperative in NAD Airway:  Mallampati  Lungs:  Clear throughout to auscultation.   Heart:  Regular rate and rhythm; no murmurs, clicks, rubs,  or gallops. Abdomen:  Soft, nontender and nondistended. Normal bowel sounds.   Neuro/Psych:  Normal mood and affect. A and O x 3  Inocente Hausen, MD [facility]enter[facility]

## 2024-04-07 ENCOUNTER — Encounter: Payer: Self-pay | Admitting: Pediatrics

## 2024-04-07 ENCOUNTER — Ambulatory Visit: Admitting: Pediatrics

## 2024-04-07 ENCOUNTER — Encounter: Admitting: Internal Medicine

## 2024-04-07 ENCOUNTER — Other Ambulatory Visit (HOSPITAL_COMMUNITY): Payer: Self-pay

## 2024-04-07 VITALS — BP 128/86 | HR 61 | Temp 97.2°F | Resp 14 | Ht 75.0 in | Wt 190.0 lb

## 2024-04-07 DIAGNOSIS — K921 Melena: Secondary | ICD-10-CM

## 2024-04-07 DIAGNOSIS — K648 Other hemorrhoids: Secondary | ICD-10-CM

## 2024-04-07 DIAGNOSIS — K209 Esophagitis, unspecified without bleeding: Secondary | ICD-10-CM

## 2024-04-07 DIAGNOSIS — K91850 Pouchitis: Secondary | ICD-10-CM | POA: Diagnosis not present

## 2024-04-07 DIAGNOSIS — K449 Diaphragmatic hernia without obstruction or gangrene: Secondary | ICD-10-CM

## 2024-04-07 DIAGNOSIS — K2980 Duodenitis without bleeding: Secondary | ICD-10-CM

## 2024-04-07 DIAGNOSIS — K51919 Ulcerative colitis, unspecified with unspecified complications: Secondary | ICD-10-CM

## 2024-04-07 DIAGNOSIS — K2970 Gastritis, unspecified, without bleeding: Secondary | ICD-10-CM

## 2024-04-07 DIAGNOSIS — Z980 Intestinal bypass and anastomosis status: Secondary | ICD-10-CM | POA: Diagnosis not present

## 2024-04-07 DIAGNOSIS — K529 Noninfective gastroenteritis and colitis, unspecified: Secondary | ICD-10-CM | POA: Diagnosis not present

## 2024-04-07 MED ORDER — HYDROCORTISONE ACETATE 25 MG RE SUPP
25.0000 mg | Freq: Every evening | RECTAL | 1 refills | Status: DC
Start: 2024-04-07 — End: 2024-07-21
  Filled 2024-04-07: qty 30, 30d supply, fill #0

## 2024-04-07 MED ORDER — SODIUM CHLORIDE 0.9 % IV SOLN: 500.0000 mL | Freq: Once | INTRAVENOUS | Status: DC

## 2024-04-07 NOTE — Patient Instructions (Addendum)
 Resume previous diet. Continue present medications. Awaiting pathology results. Pending pathology results will consider initiation of PPI Performed a pouchoscopy today. Start hydrocortisone  suppositories 25 mg nightly. Handouts provided on esophagitis, gastritis, hiatal hernia, duodenitis, and hemorrhoids.  YOU HAD AN ENDOSCOPIC PROCEDURE TODAY AT THE [facility]:   Refer to the procedure report that was given to you for any specific questions about what was found during the examination.  If the procedure report does not answer your questions, please call your gastroenterologist to clarify.  If you requested that your care partner not be given the details of your procedure findings, then the procedure report has been included in a sealed envelope for you to review at your convenience later.  YOU SHOULD EXPECT: Some feelings of bloating in the abdomen. Passage of more gas than usual.  Walking can help get rid of the air that was put into your GI tract during the procedure and reduce the bloating. If you had a lower endoscopy (such as a colonoscopy or flexible sigmoidoscopy) you may notice spotting of blood in your stool or on the toilet paper. If you underwent a bowel prep for your procedure, you may not have a normal bowel movement for a few days.  Please Note:  You might notice some irritation and congestion in your nose or some drainage.  This is from the oxygen used during your procedure.  There is no need for concern and it should clear up in a day or so.  SYMPTOMS TO REPORT IMMEDIATELY:  Following lower endoscopy (colonoscopy or flexible sigmoidoscopy):  Excessive amounts of blood in the stool  Significant tenderness or worsening of abdominal pains  Swelling of the abdomen that is new, acute  Fever of 100F or higher  Following upper endoscopy (EGD)  Vomiting of blood or coffee ground material  New chest pain or pain under the shoulder blades  Painful or persistently  difficult swallowing  New shortness of breath  Fever of 100F or higher  Black, tarry-looking stools  For urgent or emergent issues, a gastroenterologist can be reached at any hour by calling [phone]. Do not use MyChart messaging for urgent concerns.    DIET:  We do recommend a small meal at first, but then you may proceed to your regular diet.  Drink plenty of fluids but you should avoid alcoholic beverages for 24 hours.  ACTIVITY:  You should plan to take it easy for the rest of today and you should NOT DRIVE or use heavy machinery until tomorrow (because of the sedation medicines used during the test).    FOLLOW UP: Our staff will call the number listed on your records the next business day following your procedure.  We will call around 7:15- 8:00 am to check on you and address any questions or concerns that you may have regarding the information given to you following your procedure. If we do not reach you, we will leave a message.     If any biopsies were taken you will be contacted by phone or by letter within the next 1-3 weeks.  Please call us  at [phone] if you have not heard about the biopsies in 3 weeks.    SIGNATURES/CONFIDENTIALITY: You and/or your care partner have signed paperwork which will be entered into your electronic medical record.  These signatures attest to the fact that that the information above on your After Visit Summary has been reviewed and is understood.  Full responsibility of the confidentiality of this discharge  information lies with you and/or your care-partner.

## 2024-04-07 NOTE — Op Note (Signed)
 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 The patient's tolerance of                            previous anesthesia was also reviewed. The risks                            and benefits of the procedure and the sedation                            options and risks were discussed with the patient.                            All questions were answered, and informed consent                            was obtained. Prior Anticoagulants: The patient has                            taken no anticoagulant or antiplatelet agents. ASA                            Grade Assessment: II - A patient with mild systemic                            disease. After reviewing the risks and benefits,  the patient was deemed in satisfactory condition to                            undergo the procedure.                           After obtaining informed consent, the endoscope was                            passed under direct vision. Throughout the                            procedure, the patient's blood pressure, pulse, and                            oxcontinuously. Theygen saturations were  monitored                            procedure was performed without difficulty. The                            Olympus Scope [id number] was introduced through the                            anus and advanced to the neo-terminal ileum. After                            obtaining informed consent, the endoscope was                            passed under direct vision. Throughout the                            procedure, the patient's blood pressure, pulse, and                            oxcontinuously. Theygen saturations were monitored                            procedure was performed without difficulty. The                            patient tolerated the procedure well. The quality                            of the bowel preparation was adequate. Scope In: 2:27:39 PM Scope Out: 2:39:03 PM Total Procedure Duration: 0 hours 11 minutes 24 seconds  Findings:                 The perianal and digital rectal examinations were                            normal. Pertinent negatives include normal  sphincter tone and no palpable rectal lesions.                           The postsurgical anatomy appears to be consistent                            with a colectomy with handsewn ileal pouch. Only                            one lumen was seen proximal to the pouch. The                            anatomy was not typical J-pouch anatomy. A                            scattered area of moderately congested,                            erythematous and eroded mucosa was found in the                            distal region ileoanal pouch. Transition between                            rectal and small bowel mucosa was difficult to                            differentiate. Patient may have a handsewn                            anastomosis. Biopsies were taken with a cold                            forceps for histology from the distal portion of                            the  pouch/suspected rectal tissue and proximal                            region of the pouch.                           The neo-terminal ileum appeared normal -no                            inflammatory changes were seen in the small bowel                            proximal to the pouch.                           Small internal hemorrhoids were seen on  retroflexion. Complications:            No immediate complications. Estimated blood loss:                            Minimal. Estimated Blood Loss:     Estimated blood loss was minimal. Impression:               - The postsurgical anatomy appears to be consistent                            with a colectomy with handsewn ileal pouch.                           - Congested, erythematous and eroded mucosa in the                            distal ileoanal pouch. Biopsied. Findings may be                            consistent with pouchitis, possible cuff-itis; rule                            out Crohn's disease of the pouch.                           - The examined portion of the neo-terminal ileum                            proximal to the pouch appeared normal.                           - Small internal hemorrhoids noted on retroflexion. Recommendation:           - Discharge patient to home (ambulatory).                           - Await pathology results.                           - Start hydrocortisone  suppositories 25 mg per                            rectum nightly for possible colitis/bleeding                            related to internal hemorrhoids.                           - If biopsies suggest possibility of pouchitis will                            consider treatment with antibiotics                            -ciprofloxacin/metronidazole or  ciprofloxacin/tinidazole                           - Results discussed with patient.                           - Patient has a contact  number available for                            emergencies. The signs and symptoms of potential                            delayed complications were discussed with the                            patient. Return to normal activities tomorrow.                            Written discharge instructions were provided to the                            patient. Inocente Hausen, MD 04/07/2024 2:57:09 PM This report has been signed electronically.

## 2024-04-07 NOTE — Progress Notes (Signed)
 Called to room to assist during endoscopic procedure.  Patient ID and intended procedure confirmed with present staff. Received instructions for my participation in the procedure from the performing physician.

## 2024-04-07 NOTE — Progress Notes (Signed)
 Report to PACU, RN, vss, BBS= Clear.

## 2024-04-07 NOTE — Op Note (Signed)
 [facility] Patient Name: Mark Waters Procedure Date: 04/07/2024 2:08 PM MRN: [id number] Endoscopist: Inocente Hausen , MD, [id number] Age: 38 Referring MD:  Date of Birth: 07/07/1986 Gender: Male Account #: [id number] Procedure:                Upper GI endoscopy Indications:              Melena, history of ulcerative colitis status post                            total abdominal proctocolectomy with ileal pouch                            anal anastomosis Medicines:                Monitored Anesthesia Care Procedure:                Pre-Anesthesia Assessment:                           - Prior to the procedure, a History and Physical                            was performed, and patient medications and                            allergies were reviewed. The patient's tolerance of                            previous anesthesia was also reviewed. The risks                            and benefits of the procedure and the sedation                            options and risks were discussed with the patient.                            All questions were answered, and informed consent                            was obtained. Prior Anticoagulants: The patient has                            taken no anticoagulant or antiplatelet agents. ASA                            Grade Assessment: II - A patient with mild systemic                            disease. After reviewing the risks and benefits,                            the patient was deemed in satisfactory condition to  undergo the procedure.                           After obtaining informed consent, the endoscope was                            passed under direct vision. Throughout the                            procedure, the patient's blood pressure, pulse, and                            oxygen saturations were monitored continuously. The                            Olympus Scope [id number] was introduced  through the                            mouth, and advanced to the second part of duodenum.                            The upper GI endoscopy was accomplished without                            difficulty. The patient tolerated the procedure                            well. Scope In: Scope Out: Findings:                 The upper third of the esophagus and middle third                            of the esophagus were normal.                           LA Grade A (one or more mucosal breaks less than 5                            mm, not extending between tops of 2 mucosal folds)                            esophagitis was found.                           Scattered moderate inflammation characterized by                            erythema was found in the gastric body, in the                            gastric antrum and in the prepyloric region of the                            stomach. Biopsies were  taken with a cold forceps                            for Helicobacter pylori testing.                           A small hiatal hernia was present.                           Localized mild inflammation characterized by                            erosions and erythema was found in the duodenal                            bulb. Biopsies were taken with a cold forceps for                            histology.                           The second portion of the duodenum was normal. Complications:            No immediate complications. Estimated blood loss:                            Minimal. Estimated Blood Loss:     Estimated blood loss was minimal. Impression:               - Normal upper third of esophagus and middle third                            of esophagus.                           - LA Grade A esophagitis.                           - Gastritis. Biopsied.                           - Small hiatal hernia.                           - Duodenitis. Biopsied.                           - Normal  second portion of the duodenum. Recommendation:           - Await pathology results.                           - Pending pathology results will consider                            initiation of PPI.                           -  Perform a pouchoscopy today.                           - The findings and recommendations were discussed                            with the patient's family. Inocente Hausen, MD 04/07/2024 2:47:07 PM This report has been signed electronically.

## 2024-04-08 ENCOUNTER — Telehealth: Payer: Self-pay | Admitting: *Deleted

## 2024-04-08 NOTE — Telephone Encounter (Signed)
  Follow up Call-     04/07/2024    1:15 PM  Call back number  Post procedure Call Back phone  # [phone]  Permission to leave phone message Yes     Patient questions:  Do you have a fever, pain , or abdominal swelling? No. Pain Score  0 *  Have you tolerated food without any problems? Yes.    Have you been able to return to your normal activities? Yes.    Do you have any questions about your discharge instructions: Diet   No. Medications  No. Follow up visit  No.  Do you have questions or concerns about your Care? No.  Actions: * If pain score is 4 or above: No action needed, pain <4.

## 2024-04-12 LAB — SURGICAL PATHOLOGY

## 2024-04-14 ENCOUNTER — Ambulatory Visit: Payer: Self-pay | Admitting: Pediatrics

## 2024-04-18 ENCOUNTER — Ambulatory Visit (HOSPITAL_BASED_OUTPATIENT_CLINIC_OR_DEPARTMENT_OTHER): Payer: 59 | Admitting: Family Medicine

## 2024-04-20 ENCOUNTER — Encounter (HOSPITAL_COMMUNITY): Payer: Self-pay | Admitting: Pharmacist

## 2024-04-20 ENCOUNTER — Other Ambulatory Visit (HOSPITAL_COMMUNITY): Payer: Self-pay

## 2024-05-22 ENCOUNTER — Emergency Department (HOSPITAL_COMMUNITY)
Admission: EM | Admit: 2024-05-22 | Discharge: 2024-05-22 | Disposition: A | Discharge status: Home / Self Care | Attending: Emergency Medicine | Admitting: Emergency Medicine

## 2024-05-22 ENCOUNTER — Emergency Department (HOSPITAL_COMMUNITY)

## 2024-05-22 ENCOUNTER — Other Ambulatory Visit: Payer: Self-pay

## 2024-05-22 DIAGNOSIS — G40909 Epilepsy, unspecified, not intractable, without status epilepticus: Secondary | ICD-10-CM | POA: Insufficient documentation

## 2024-05-22 DIAGNOSIS — R61 Generalized hyperhidrosis: Secondary | ICD-10-CM | POA: Diagnosis not present

## 2024-05-22 DIAGNOSIS — S0990XA Unspecified injury of head, initial encounter: Secondary | ICD-10-CM | POA: Diagnosis not present

## 2024-05-22 DIAGNOSIS — Z79899 Other long term (current) drug therapy: Secondary | ICD-10-CM | POA: Diagnosis not present

## 2024-05-22 DIAGNOSIS — M542 Cervicalgia: Secondary | ICD-10-CM | POA: Insufficient documentation

## 2024-05-22 DIAGNOSIS — Z7982 Long term (current) use of aspirin: Secondary | ICD-10-CM | POA: Insufficient documentation

## 2024-05-22 DIAGNOSIS — S61011A Laceration without foreign body of right thumb without damage to nail, initial encounter: Secondary | ICD-10-CM | POA: Insufficient documentation

## 2024-05-22 DIAGNOSIS — R569 Unspecified convulsions: Secondary | ICD-10-CM

## 2024-05-22 DIAGNOSIS — S60512A Abrasion of left hand, initial encounter: Secondary | ICD-10-CM | POA: Diagnosis not present

## 2024-05-22 DIAGNOSIS — F17210 Nicotine dependence, cigarettes, uncomplicated: Secondary | ICD-10-CM | POA: Insufficient documentation

## 2024-05-22 DIAGNOSIS — T07XXXA Unspecified multiple injuries, initial encounter: Secondary | ICD-10-CM

## 2024-05-22 DIAGNOSIS — W01198A Fall on same level from slipping, tripping and stumbling with subsequent striking against other object, initial encounter: Secondary | ICD-10-CM | POA: Insufficient documentation

## 2024-05-22 DIAGNOSIS — S80211A Abrasion, right knee, initial encounter: Secondary | ICD-10-CM | POA: Diagnosis not present

## 2024-05-22 DIAGNOSIS — S6991XA Unspecified injury of right wrist, hand and finger(s), initial encounter: Secondary | ICD-10-CM | POA: Diagnosis present

## 2024-05-22 DIAGNOSIS — S80212A Abrasion, left knee, initial encounter: Secondary | ICD-10-CM | POA: Insufficient documentation

## 2024-05-22 LAB — COMPREHENSIVE METABOLIC PANEL WITH GFR
ALT: 27 U/L (ref 0–44)
AST: 33 U/L (ref 15–41)
Albumin: 5 g/dL (ref 3.5–5.0)
Alkaline Phosphatase: 100 U/L (ref 38–126)
Anion gap: 17 — ABNORMAL HIGH (ref 5–15)
BUN: 13 mg/dL (ref 6–20)
CO2: 22 mmol/L (ref 22–32)
Calcium: 9.9 mg/dL (ref 8.9–10.3)
Chloride: 100 mmol/L (ref 98–111)
Creatinine, Ser: 0.93 mg/dL (ref 0.61–1.24)
GFR, Estimated: >60 mL/min (ref >60)
Glucose, Bld: 111 mg/dL — ABNORMAL HIGH (ref 70–99)
Potassium: 4.2 mmol/L (ref 3.5–5.1)
Sodium: 139 mmol/L (ref 135–145)
Total Bilirubin: 0.4 mg/dL (ref 0.0–1.2)
Total Protein: 8 g/dL (ref 6.5–8.1)

## 2024-05-22 LAB — CBC
HCT: 46.6 % (ref 39.0–52.0)
Hemoglobin: 15.3 g/dL (ref 13.0–17.0)
MCH: 29.7 pg (ref 26.0–34.0)
MCHC: 32.8 g/dL (ref 30.0–36.0)
MCV: 90.3 fL (ref 80.0–100.0)
Platelets: 212 K/uL (ref 150–400)
RBC: 5.16 MIL/uL (ref 4.22–5.81)
RDW: 12.1 % (ref 11.5–15.5)
WBC: 9.9 K/uL (ref 4.0–10.5)
nRBC: 0 % (ref 0.0–0.2)

## 2024-05-22 LAB — CBG MONITORING, ED: Glucose-Capillary: 156 mg/dL — ABNORMAL HIGH (ref 70–99)

## 2024-05-22 MED ORDER — SODIUM CHLORIDE 0.9 % IV BOLUS
1000.0000 mL | Freq: Once | INTRAVENOUS | Status: AC
  Administered 2024-05-22: 1000 mL via INTRAVENOUS

## 2024-05-22 MED ORDER — LEVETIRACETAM (KEPPRA) 500 MG/5 ML ADULT IV PUSH
1000.0000 mg | Freq: Once | INTRAVENOUS | Status: AC
  Administered 2024-05-22: 1000 mg via INTRAVENOUS
  Filled 2024-05-22: qty 10

## 2024-05-22 NOTE — Discharge Instructions (Signed)
 I put in a follow-up with neurology.  Call the office to see if they would like to see you in person or just discuss over the phone.  A level of your Keppra  in the blood has been drawn but has not resulted yet.

## 2024-05-22 NOTE — ED Provider Notes (Signed)
 [place] EMERGENCY DEPARTMENT AT [facility] Provider Note   CSN: [id number] Arrival date & time: 05/22/24  1210     Patient presents with: Seizures and Laceration   Mark Waters is a 38 y.o. male.    Seizures Laceration  Patient presents with seizure.  History of seizure disorder and on Keppra .  States he has been taking his medicines will sometimes take it a little earlier later during the day but states he does get both of his 500 mg pills and during the day.  States he hit his head.  Does have neck pain.  Has pain in both his knees and reportedly has a large laceration to the right thumb.  Has been wrapped up by EMS.  Reportedly had a fair amount of bleeding.  Last tetanus is within the last 2 years.    Prior to Admission medications   Medication Sig Start Date End Date Taking? Authorizing Provider  ADACEL 01-07-14.5 LF-MCG/0.5 injection  07/10/23   [provider]  aspirin-acetaminophen -caffeine (EXCEDRIN MIGRAINE) 250-250-65 MG tablet Take by mouth.    [provider]  COMIRNATY syringe  07/10/23   [provider]  FLUBLOK 0.5 ML SOSY  07/10/23   [provider]  HEPLISAV-B injection  07/10/23   [provider]  hydrocortisone  (ANUSOL -HC) 25 MG suppository Place 1 suppository (25 mg total) rectally at bedtime. 04/07/24   Suzann Inocente HERO, MD  levETIRAcetam  (KEPPRA ) 1000 MG tablet Take 1 tablet (1,000 mg total) by mouth 2 (two) times daily. 03/31/24 03/26/25  CaudleThersia Bitters, FNP  nicotine  polacrilex (COMMIT) 4 MG lozenge Take 1 lozenge (4 mg total) by mouth as needed for smoking cessation. 07/13/23   de [place], Raymond J, MD  sertraline  (ZOLOFT ) 50 MG tablet Take 1 tablet by mouth once daily 03/21/24   [place], Quintin PARAS, MD    Allergies: Patient has no known allergies.    Review of Systems  Neurological:  Positive for seizures.    Updated Vital Signs BP (!) 154/97   Pulse 94   Temp 97.6 F (36.4 C) (Oral)   Resp 18    Wt 89.4 kg   SpO2 99%   BMI 24.62 kg/m   Physical Exam Vitals and nursing note reviewed.  Constitutional:      Appearance: He is diaphoretic.  HENT:     Head: Atraumatic.  Neck:     Comments:  mild midline tenderness. Cardiovascular:     Rate and Rhythm: Regular rhythm.  Abdominal:     Tenderness: There is no abdominal tenderness.  Musculoskeletal:     Comments: Right hand wrapped up.  Ring removed.  Abrasions to left hand without underlying bony tenderness.  Abrasions to bilateral knees with some tenderness but no deformity.  Skin:    Capillary Refill: Capillary refill takes less than 2 seconds.  Neurological:     Mental Status: He is alert.     (all labs ordered are listed, but only abnormal results are displayed) Labs Reviewed  COMPREHENSIVE METABOLIC PANEL WITH GFR - Abnormal; Notable for the following components:      Result Value   Glucose, Bld 111 (*)    Anion gap 17 (*)    All other components within normal limits  CBG MONITORING, ED - Abnormal; Notable for the following components:   Glucose-Capillary 156 (*)    All other components within normal limits  CBC  LEVETIRACETAM  LEVEL    EKG: None  Radiology: CT HEAD WO CONTRAST ( )  Result Date: 05/22/2024 CLINICAL DATA:  Head and neck trauma seizure EXAM: CT HEAD WITHOUT CONTRAST CT CERVICAL SPINE WITHOUT CONTRAST TECHNIQUE: Multidetector CT imaging of the head and cervical spine was performed following the standard protocol without intravenous contrast. Multiplanar CT image reconstructions of the cervical spine were also generated. RADIATION DOSE REDUCTION: This exam was performed according to the departmental dose-optimization program which includes automated exposure control, adjustment of the mA and/or kV according to patient size and/or use of iterative reconstruction technique. COMPARISON:  CT brain and cervical spine 12/16/2020 FINDINGS: CT HEAD FINDINGS Brain: No acute territorial infarction, hemorrhage or  intracranial mass. Redemonstrated encephalomalacia within the left frontal and parietooccipital lobes. Mild ex vacuo enlargement of the left posterolateral ventricle. Ventricles are stable in size and morphology. Vascular: No hyperdense vessels.  No unexpected calcification Skull: Normal. Negative for fracture or focal lesion. Sinuses/Orbits: Moderate mucosal thickening in the sinuses Other: None CT CERVICAL SPINE FINDINGS Alignment: No subluxation.  Facet alignment is normal. Skull base and vertebrae: No acute fracture. No primary bone lesion or focal pathologic process. Soft tissues and spinal canal: No prevertebral fluid or swelling. No visible canal hematoma. Disc levels: Minimal degenerative change C4-C5 and C5-C6. Mild left foraminal narrowing C6-C7 and on the right at C3-C4, C4-C5 and C5-C6. Upper chest: Negative. Other: None IMPRESSION: 1. No CT evidence for acute intracranial abnormality. Chronic left frontal and parietooccipital encephalomalacia. 2. Mild degenerative changes of the cervical spine. No acute osseous abnormality. Electronically Signed   By: Luke Bun M.D.   On: 05/22/2024 15:58   CT Cervical Spine Wo Contrast Result Date: 05/22/2024 CLINICAL DATA:  Head and neck trauma seizure EXAM: CT HEAD WITHOUT CONTRAST CT CERVICAL SPINE WITHOUT CONTRAST TECHNIQUE: Multidetector CT imaging of the head and cervical spine was performed following the standard protocol without intravenous contrast. Multiplanar CT image reconstructions of the cervical spine were also generated. RADIATION DOSE REDUCTION: This exam was performed according to the departmental dose-optimization program which includes automated exposure control, adjustment of the mA and/or kV according to patient size and/or use of iterative reconstruction technique. COMPARISON:  CT brain and cervical spine 12/16/2020 FINDINGS: CT HEAD FINDINGS Brain: No acute territorial infarction, hemorrhage or intracranial mass. Redemonstrated  encephalomalacia within the left frontal and parietooccipital lobes. Mild ex vacuo enlargement of the left posterolateral ventricle. Ventricles are stable in size and morphology. Vascular: No hyperdense vessels.  No unexpected calcification Skull: Normal. Negative for fracture or focal lesion. Sinuses/Orbits: Moderate mucosal thickening in the sinuses Other: None CT CERVICAL SPINE FINDINGS Alignment: No subluxation.  Facet alignment is normal. Skull base and vertebrae: No acute fracture. No primary bone lesion or focal pathologic process. Soft tissues and spinal canal: No prevertebral fluid or swelling. No visible canal hematoma. Disc levels: Minimal degenerative change C4-C5 and C5-C6. Mild left foraminal narrowing C6-C7 and on the right at C3-C4, C4-C5 and C5-C6. Upper chest: Negative. Other: None IMPRESSION: 1. No CT evidence for acute intracranial abnormality. Chronic left frontal and parietooccipital encephalomalacia. 2. Mild degenerative changes of the cervical spine. No acute osseous abnormality. Electronically Signed   By: Luke Bun M.D.   On: 05/22/2024 15:58   DG Knee Complete 4 Views Left Result Date: 05/22/2024 CLINICAL DATA:  Trauma EXAM: LEFT KNEE - COMPLETE 4+ VIEW COMPARISON:  None Available. FINDINGS: No evidence of fracture, dislocation, or joint effusion. No evidence of arthropathy or other focal bone abnormality. Soft tissues are unremarkable. IMPRESSION: Negative. Electronically Signed   By: Greig Maple HERO.D.  On: 05/22/2024 15:25   DG Hand Complete Right Result Date: 05/22/2024 CLINICAL DATA:  Trauma EXAM: RIGHT HAND - COMPLETE 3+ VIEW COMPARISON:  None Available. FINDINGS: There is a small horizontal lucency through the base of the first proximal phalanx on single view only. Nondisplaced fractures not excluded. There is no dislocation. Joint spaces are well maintained. Soft tissue laceration is seen overlying the first interphalangeal joint. No radiopaque foreign body identified.  IMPRESSION: 1. Small horizontal lucency through the base of the first proximal phalanx on single view only. Nondisplaced fracture is not excluded. 2. Soft tissue laceration overlying the first interphalangeal joint. Electronically Signed   By: Greig Pique M.D.   On: 05/22/2024 15:24   DG Knee Complete 4 Views Right Result Date: 05/22/2024 CLINICAL DATA:  Trauma EXAM: DG KNEE COMPLETE 4+V*R* COMPARISON:  None Available. FINDINGS: No evidence of fracture, dislocation, or joint effusion. No evidence of arthropathy or other focal bone abnormality. Soft tissues are unremarkable. IMPRESSION: Negative. Electronically Signed   By: Greig Pique M.D.   On: 05/22/2024 15:23     Procedures   Medications Ordered in the ED  sodium chloride  0.9 % bolus 1,000 mL (0 mLs Intravenous Stopped 05/22/24 1624)  levETIRAcetam  (KEPPRA ) undiluted injection 1,000 mg (1,000 mg Intravenous Given 05/22/24 1330)                                    Medical Decision Making Amount and/or Complexity of Data Reviewed Labs: ordered. Radiology: ordered.   Patient with seizure and fall.  Has had some vomiting recently.  Potentially vomited up some of his medications.  However does contain compliance otherwise.  Will give dose of loading Keppra .  Also potential injuries.  Laceration to thumb.  However will get x-ray to evaluate bony abnormalities first.  X-ray reassuring.  Did have potential fracture however no tenderness at the spot indicated on the x-ray.  Reviewed the wounds and nothing that need closing.  More abrasions.  Reviewed notes and is on 1000 mg twice a day of Keppra .  With the vomiting may not have been kept down so will not increase dose right now.  Keppra  level sent.  Can follow-up with his neurologist.  Will discharge home     Final diagnoses:  Seizure (HCC)  Abrasions of multiple sites    ED Discharge Orders          Ordered    Ambulatory referral to Neurology       Comments: An appointment  is requested in approximately: 1 week   05/22/24 1607               Patsey Lot, MD 05/22/24 2311

## 2024-05-22 NOTE — ED Triage Notes (Signed)
 Pt arrives via EMS from home. Pt reports that he was standing outside smoking a cigarette when he felt an aura, then he had a seizure. Pt now fully A&O. EMS reports a notable RIGHT thumb/hand laceration. Minor LEFT hand abrasion, LEFT knee laceration.   Pt takes Keppra , and denies missing any does. Pt does report instances of taking his medication a few hours off due to work schedule.   Pt was not incontinent.

## 2024-05-24 LAB — LEVETIRACETAM LEVEL: Levetiracetam Lvl: 6.8 ug/mL — ABNORMAL LOW (ref 10.0–40.0)

## 2024-05-29 DIAGNOSIS — S60011A Contusion of right thumb without damage to nail, initial encounter: Secondary | ICD-10-CM | POA: Insufficient documentation

## 2024-05-30 ENCOUNTER — Institutional Professional Consult (permissible substitution): Admitting: Neurology

## 2024-06-01 ENCOUNTER — Other Ambulatory Visit (HOSPITAL_COMMUNITY): Payer: Self-pay

## 2024-06-02 ENCOUNTER — Other Ambulatory Visit (HOSPITAL_COMMUNITY): Payer: Self-pay

## 2024-06-03 ENCOUNTER — Other Ambulatory Visit (HOSPITAL_COMMUNITY): Payer: Self-pay

## 2024-06-04 ENCOUNTER — Other Ambulatory Visit (HOSPITAL_COMMUNITY): Payer: Self-pay

## 2024-06-06 ENCOUNTER — Other Ambulatory Visit: Payer: Self-pay

## 2024-06-06 ENCOUNTER — Other Ambulatory Visit (HOSPITAL_COMMUNITY): Payer: Self-pay

## 2024-06-08 ENCOUNTER — Other Ambulatory Visit (HOSPITAL_COMMUNITY): Payer: Self-pay

## 2024-06-13 ENCOUNTER — Encounter: Payer: Self-pay | Admitting: Neurology

## 2024-06-20 ENCOUNTER — Other Ambulatory Visit (HOSPITAL_COMMUNITY): Payer: Self-pay

## 2024-06-27 ENCOUNTER — Other Ambulatory Visit (HOSPITAL_COMMUNITY): Payer: Self-pay

## 2024-07-21 ENCOUNTER — Ambulatory Visit (INDEPENDENT_AMBULATORY_CARE_PROVIDER_SITE_OTHER): Payer: PRIVATE HEALTH INSURANCE | Admitting: Neurology

## 2024-07-21 ENCOUNTER — Encounter: Payer: Self-pay | Admitting: Neurology

## 2024-07-21 ENCOUNTER — Other Ambulatory Visit (HOSPITAL_COMMUNITY): Payer: Self-pay

## 2024-07-21 VITALS — BP 138/88 | HR 77 | Ht 75.0 in | Wt 193.5 lb

## 2024-07-21 DIAGNOSIS — Z8673 Personal history of transient ischemic attack (TIA), and cerebral infarction without residual deficits: Secondary | ICD-10-CM | POA: Diagnosis not present

## 2024-07-21 DIAGNOSIS — G40209 Localization-related (focal) (partial) symptomatic epilepsy and epileptic syndromes with complex partial seizures, not intractable, without status epilepticus: Secondary | ICD-10-CM | POA: Diagnosis not present

## 2024-07-21 MED ORDER — LEVETIRACETAM 1000 MG PO TABS
1000.0000 mg | ORAL_TABLET | Freq: Two times a day (BID) | ORAL | 3 refills | Status: AC
  Filled 2024-07-21 – 2024-08-10 (×2): qty 180, 90d supply, fill #0
  Filled 2024-08-30: qty 180, 90d supply, fill #1
  Filled 2024-08-31: qty 120, 60d supply, fill #1
  Filled 2024-10-08: qty 30, 15d supply, fill #2

## 2024-07-21 NOTE — Patient Instructions (Addendum)
 Continue with Keppra  1000 mg twice daily Schedule routine EEG Please call for any additional seizures, concerns Discussed driving restriction for total of 6 months Return as scheduled in July

## 2024-07-21 NOTE — Progress Notes (Signed)
 [facility]  PATIENT: Mark Waters DOB: [birth year]  REQUESTING CLINICIAN: Patsey Lot, MD HISTORY FROM: Patient  REASON FOR VISIT: Seizure disorder    HISTORICAL  CHIEF COMPLAINT:  Chief Complaint  Patient presents with   New Patient (Initial Visit)    Rm12, alone, Urgent internal referral for seizure: last sz August 2025 because patient couldn't keep meds down due to sickness    INTERVAL HISTORY 07/21/2024 Patient presents today for follow-up, last visit was in July.  At that time he was seizure-free for about 2 years, he was continued on Keppra  1000 mg twice daily.  Unfortunately in September he did have a breakthrough seizure in the setting of illness.  He tells me he was sick for about 3 to 4 days, with vomiting and on the last day he did have a seizure.  He presented to the ED, his Keppra  level was low at 6.  He did have some injuries mainly bruises in his hands and knees.  Since being back on Keppra , he has been back to his normal self, no other breakthrough seizure.     INTERVAL HISTORY 03/15/2024:  Patient presents today for follow-up, last visit was in June 2023.  At that time we continued him on Keppra  1000 mg twice daily.  He denies any seizure or seizure like activity, compliant with his medication.  He is presenting today because he needs a surgical clearance for procedure under general anesthesia scheduled at the end month.  No other complaints, no other concerns, again he is compliant with his medications, no side effect and no seizures.    HISTORY OF PRESENT ILLNESS:  This is a 38 year old gentleman past medical history of ulcerative colitis, previous stroke and seizure in 1998 who is presenting to establish care.  Patient reported in 1998 he had a bout of ulcerative colitis infection, and from there he developed clots and had a left hemispheric stroke.  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  Initially  he was put on Keppra , did well and was seizure-free until 2018 when he had recurrent seizure.  At that time, his neurologist told him that the seizure might have been provoked by alcohol.  He was put back on Keppra  500 mg twice daily but have recurrent seizures therefore he was increased to 1000 mg twice daily.  Since then he has been doing well but will have breakthrough seizure in the setting of missing his dose of medication, the last seizure was 2 weeks ago and this is in the setting of missing his Keppra  dose but prior to that it was 2 years ago.   With his seizure, he reports that he can tell before the seizure start, usually he does not feel right, he has an abnormal sensation that he cannot describe and sometimes he will have vision changes.  He will feel burning sensation with his right arm and sometimes he will have right hand automatisms prior to losing consciousness and passing out.  With the seizure he has bruises and tongue biting but no broken bones.    Handedness: Right handed   Onset: 1998   Seizure Type: Focal to bilateral   Current frequency: Last seizure 2 weeks but prior to that 3 years ago   Any injuries from seizures: None   Seizure risk factors: Prior Strokes   Previous ASMs: Levetiracetam    Currenty ASMs: Levetiracetam  1000 mg BID   ASMs side effects: None   Brain  Images: Old left MCA stroke  Previous EEGs: Not previously done    OTHER MEDICAL CONDITIONS: Left hemispheric stroke, epilepsy   REVIEW OF SYSTEMS: Full 14 system review of systems performed and negative with exception of: as noted in the HPI   ALLERGIES: No Known Allergies  HOME MEDICATIONS: Outpatient Medications Prior to Visit  Medication Sig Dispense Refill   ADACEL 01-07-14.5 LF-MCG/0.5 injection      aspirin-acetaminophen -caffeine (EXCEDRIN MIGRAINE) 250-250-65 MG tablet Take by mouth.     COMIRNATY syringe      FLUBLOK 0.5 ML SOSY      HEPLISAV-B injection      nicotine  polacrilex  (COMMIT) 4 MG lozenge Take 1 lozenge (4 mg total) by mouth as needed for smoking cessation. 100 tablet 1   sertraline  (ZOLOFT ) 50 MG tablet Take 1 tablet by mouth once daily 90 tablet 0   levETIRAcetam  (KEPPRA ) 1000 MG tablet Take 1 tablet (1,000 mg total) by mouth 2 (two) times daily. 180 tablet 1   hydrocortisone  (ANUSOL -HC) 25 MG suppository Place 1 suppository (25 mg total) rectally at bedtime. 30 suppository 1   No facility-administered medications prior to visit.    PAST MEDICAL HISTORY: Past Medical History:  Diagnosis Date   Blood transfusion abn reaction or complication, no procedure mishap    Blood transfusion without reported diagnosis    Chronic headaches    since childhood   Clotting disorder    Clotting disorder    Depression    Pouchitis (HCC)    Seizures (HCC)    first in 1998, has had 3 seizures   Stroke (HCC)    x 2   Ulcerative colitis (HCC)     PAST SURGICAL HISTORY: Past Surgical History:  Procedure Laterality Date   SHOULDER SURGERY Left    3 rods in left shoulder   TOTAL COLECTOMY     age 78, has a J pouch    FAMILY HISTORY: History reviewed. No pertinent family history.  SOCIAL HISTORY: Social History   Socioeconomic History   Marital status: Single    Spouse name: Not on file   Number of children: 0   Years of education: Not on file   Highest education level: Associate degree: occupational, scientist, product/process development, or vocational program  Occupational History   Not on file  Tobacco Use   Smoking status: Every Day    Types: Cigarettes    Passive exposure: Current   Smokeless tobacco: Never  Vaping Use   Vaping status: Former  Substance and Sexual Activity   Alcohol use: Yes   Drug use: Yes    Types: Marijuana   Sexual activity: Not on file  Other Topics Concern   Not on file  Social History Narrative   Not on file   Social Drivers of Health   Financial Resource Strain: High Risk (07/10/2023)   Overall Financial Resource Strain (CARDIA)     Difficulty of Paying Living Expenses: Very hard  Food Insecurity: Food Insecurity Present (07/10/2023)   Hunger Vital Sign    Worried About Running Out of Food in the Last Year: Often true    Ran Out of Food in the Last Year: Often true  Transportation Needs: No Transportation Needs (07/10/2023)   PRAPARE - Administrator, Civil Service (Medical): No    Lack of Transportation (Non-Medical): No  Physical Activity: Insufficiently Active (07/10/2023)   Exercise Vital Sign    Days of Exercise per Week: 5 days    Minutes of Exercise per  Session: 20 min  Stress: Stress Concern Present (07/10/2023)   [employer] of Occupational Health - Occupational Stress Questionnaire    Feeling of Stress : To some extent  Social Connections: Socially Isolated (07/10/2023)   Social Connection and Isolation Panel    Frequency of Communication with Friends and Family: Once a week    Frequency of Social Gatherings with Friends and Family: Once a week    Attends Religious Services: 1 to 4 times per year    Active Member of [employer] or Organizations: No    Attends Engineer, Structural: Not on file    Marital Status: Never married  Intimate Partner Violence: Not At Risk (10/03/2023)   Received from [facility]   HITS    Over the last 12 months how often did your partner physically hurt you?: Never    Over the last 12 months how often did your partner insult you or talk down to you?: Never    Over the last 12 months how often did your partner threaten you with physical harm?: Never    Over the last 12 months how often did your partner scream or curse at you?: Never    PHYSICAL EXAM  GENERAL EXAM/CONSTITUTIONAL: Vitals:  Vitals:   07/21/24 0812  BP: 138/88  Pulse: 77  SpO2: 98%  Weight: 193 lb 8 oz (87.8 kg)  Height: 6' 3 (1.905 m)    Body mass index is 24.19 kg/m. Wt Readings from Last 3 Encounters:  07/21/24 193 lb 8 oz (87.8 kg)  05/22/24 197 lb (89.4 kg)  04/07/24 190 lb  (86.2 kg)   Patient is in no distress; well developed, nourished and groomed; neck is supple  MUSCULOSKELETAL: Gait, strength, tone, movements noted in Neurologic exam below  NEUROLOGIC: MENTAL STATUS:      No data to display         awake, alert, oriented to person, place and time recent and remote memory intact normal attention and concentration language fluent, comprehension intact, naming intact fund of knowledge appropriate  CRANIAL NERVE:  2nd, 3rd, 4th, 6th - visual fields full to confrontation, extraocular muscles intact, no nystagmus 5th - facial sensation symmetric 7th - facial strength symmetric 8th - hearing intact 9th - palate elevates symmetrically, uvula midline 11th - shoulder shrug symmetric 12th - tongue protrusion midline  MOTOR:  normal bulk and tone, full strength in the BUE, BLE  SENSORY:  normal and symmetric to light touch  COORDINATION:  finger-nose-finger, fine finger movements normal  GAIT/STATION:  normal   DIAGNOSTIC DATA (LABS, IMAGING, TESTING) - I reviewed patient records, labs, notes, testing and imaging myself where available.  Lab Results  Component Value Date   WBC 9.9 05/22/2024   HGB 15.3 05/22/2024   HCT 46.6 05/22/2024   MCV 90.3 05/22/2024   PLT 212 05/22/2024      Component Value Date/Time   NA 139 05/22/2024 1331   NA 138 10/20/2023 1412   K 4.2 05/22/2024 1331   CL 100 05/22/2024 1331   CO2 22 05/22/2024 1331   GLUCOSE 111 (H) 05/22/2024 1331   BUN 13 05/22/2024 1331   BUN 11 10/20/2023 1412   CREATININE 0.93 05/22/2024 1331   CALCIUM 9.9 05/22/2024 1331   PROT 8.0 05/22/2024 1331   PROT 7.5 10/20/2023 1412   ALBUMIN 5.0 05/22/2024 1331   ALBUMIN 5.0 10/20/2023 1412   AST 33 05/22/2024 1331   ALT 27 05/22/2024 1331   ALKPHOS 100 05/22/2024 1331  BILITOT 0.4 05/22/2024 1331   BILITOT 0.3 10/20/2023 1412   GFRNONAA >60 05/22/2024 1331   Lab Results  Component Value Date   CHOL 285 (H) 10/20/2023    HDL 84 10/20/2023   LDLCALC 185 (H) 10/20/2023   TRIG 94 10/20/2023   Lab Results  Component Value Date   HGBA1C 6.2 (H) 10/20/2023   No results found for: [id number] Lab Results  Component Value Date   TSH 1.150 10/20/2023    Head CT 12/16/2020 1. Old left MCA territory infarct without acute intracranial abnormality. 2. No acute fracture or static subluxation of the cervical spine     ASSESSMENT AND PLAN  38 y.o. year old male  with history of ulcerative colitis, old left MCA territory stroke and epilepsy who is presenting for follow-up.  He is doing well on Keppra  1000 mg twice daily, was previously seizure-free for about 2 years but in September, unfortunately he had a breakthrough seizure in the setting of illness, including vomiting for 3 to 4 days.  At that time his Keppra  level was low at 6.  Since then, he has been doing well, no additional seizures.  No additional vomiting.  He is continued on Keppra  1000 mg twice daily.  Plan will be to continue patient on Keppra  1000 mg twice daily and I will see him as scheduled in July.  Advised him to contact me if he does have any breakthrough seizure or any other issues.  Also advised him to schedule his routine EEG.  We discussed driving restriction for total of 6 months, he voiced understanding   1. Partial symptomatic epilepsy with complex partial seizures, not intractable, without status epilepticus (HCC)   2. History of ischemic left MCA stroke     Patient Instructions  Continue with Keppra  1000 mg twice daily Schedule routine EEG Please call for any additional seizures, concerns Discussed driving restriction for total of 6 months Return as scheduled in July   Per [place]  DMV statutes, patients with seizures are not allowed to drive until they have been seizure-free for six months.  Other recommendations include using caution when using heavy equipment or power tools. Avoid working on ladders or at heights. Take  showers instead of baths.  Do not swim alone.  Ensure the water temperature is not too high on the home water heater. Do not go swimming alone. Do not lock yourself in a room alone (i.e. bathroom). When caring for infants or small children, sit down when holding, feeding, or changing them to minimize risk of injury to the child in the event you have a seizure. Maintain good sleep hygiene. Avoid alcohol.  Also recommend adequate sleep, hydration, good diet and minimize stress.   During the Seizure  - First, ensure adequate ventilation and place patients on the floor on their left side  Loosen clothing around the neck and ensure the airway is patent. If the patient is clenching the teeth, do not force the mouth open with any object as this can cause severe damage - Remove all items from the surrounding that can be hazardous. The patient may be oblivious to what's happening and may not even know what he or she is doing. If the patient is confused and wandering, either gently guide him/her away and block access to outside areas - Reassure the individual and be comforting - Call 911. In most cases, the seizure ends before EMS arrives. However, there are cases when seizures may last over 3 to 5 minutes.  Or the individual may have developed breathing difficulties or severe injuries. If a pregnant patient or a person with diabetes develops a seizure, it is prudent to call an ambulance. - Finally, if the patient does not regain full consciousness, then call EMS. Most patients will remain confused for about 45 to 90 minutes after a seizure, so you must use judgment in calling for help. - Avoid restraints but make sure the patient is in a bed with padded side rails - Place the individual in a lateral position with the neck slightly flexed; this will help the saliva drain from the mouth and prevent the tongue from falling backward - Remove all nearby furniture and other hazards from the area - Provide verbal  assurance as the individual is regaining consciousness - Provide the patient with privacy if possible - Call for help and start treatment as ordered by the caregiver   After the Seizure (Postictal Stage)  After a seizure, most patients experience confusion, fatigue, muscle pain and/or a headache. Thus, one should permit the individual to sleep. For the next few days, reassurance is essential. Being calm and helping reorient the person is also of importance.  Most seizures are painless and end spontaneously. Seizures are not harmful to others but can lead to complications such as stress on the lungs, brain and the heart. Individuals with prior lung problems may develop labored breathing and respiratory distress.     No orders of the defined types were placed in this encounter.   Meds ordered this encounter  Medications   levETIRAcetam  (KEPPRA ) 1000 MG tablet    Sig: Take 1 tablet (1,000 mg total) by mouth 2 (two) times daily.    Dispense:  180 tablet    Refill:  3    No follow-ups on file.    Pastor Falling, MD 07/21/2024, 8:41 AM  [facility] [address], [address] [place], [place] [address] [phone]

## 2024-08-01 ENCOUNTER — Ambulatory Visit: Payer: Self-pay | Admitting: Neurology

## 2024-08-01 ENCOUNTER — Ambulatory Visit: Payer: PRIVATE HEALTH INSURANCE | Admitting: Neurology

## 2024-08-01 DIAGNOSIS — G40209 Localization-related (focal) (partial) symptomatic epilepsy and epileptic syndromes with complex partial seizures, not intractable, without status epilepticus: Secondary | ICD-10-CM

## 2024-08-01 NOTE — Procedures (Signed)
    History: 38 year old man with seizures   EEG classification: Awake and drowsy  Duration: 27 minutes   Technical aspects: This EEG study was done with scalp electrodes positioned according to the 10-20 International system of electrode placement. Electrical activity was reviewed with band pass filter of 1-70Hz , sensitivity of 7 uV/mm, display speed of 80mm/sec with a 60Hz  notched filter applied as appropriate. EEG data were recorded continuously and digitally stored.   Description of the recording: The background rhythms of this recording consists of a fairly well modulated medium amplitude alpha rhythm of 9-10 Hz that is reactive to eye opening and closure. Present in the anterior head region is a 15-20 Hz beta activity. Photic stimulation was performed, did not show any abnormalities. Hyperventilation was also performed, did not show any abnormalities. Drowsiness was manifested by background fragmentation. No abnormal epileptiform discharges seen during this recording. There was no focal slowing. There were no electrographic seizure identified.   Abnormality: None   Impression: This is a normal awake and drowsy EEG. No evidence of interictal epileptiform discharges. Normal EEGs, however, do not rule out epilepsy.    Sophiah Rolin, MD [facility]

## 2024-08-10 ENCOUNTER — Other Ambulatory Visit: Payer: Self-pay

## 2024-08-10 ENCOUNTER — Other Ambulatory Visit (HOSPITAL_COMMUNITY): Payer: Self-pay

## 2024-08-12 ENCOUNTER — Encounter: Payer: Self-pay | Admitting: Neurology

## 2024-08-12 ENCOUNTER — Encounter (HOSPITAL_BASED_OUTPATIENT_CLINIC_OR_DEPARTMENT_OTHER): Payer: Self-pay | Admitting: Family Medicine

## 2024-08-15 ENCOUNTER — Encounter (HOSPITAL_BASED_OUTPATIENT_CLINIC_OR_DEPARTMENT_OTHER): Payer: Self-pay

## 2024-08-17 ENCOUNTER — Encounter: Payer: Self-pay | Admitting: Neurology

## 2024-08-17 ENCOUNTER — Other Ambulatory Visit (HOSPITAL_COMMUNITY): Payer: Self-pay

## 2024-08-30 ENCOUNTER — Other Ambulatory Visit (HOSPITAL_COMMUNITY): Payer: Self-pay

## 2024-08-31 ENCOUNTER — Other Ambulatory Visit: Payer: Self-pay

## 2024-08-31 ENCOUNTER — Other Ambulatory Visit (HOSPITAL_COMMUNITY): Payer: Self-pay

## 2024-09-06 ENCOUNTER — Other Ambulatory Visit (HOSPITAL_COMMUNITY): Payer: Self-pay

## 2024-09-07 ENCOUNTER — Other Ambulatory Visit (HOSPITAL_COMMUNITY): Payer: Self-pay

## 2024-09-09 ENCOUNTER — Other Ambulatory Visit (HOSPITAL_COMMUNITY): Payer: Self-pay

## 2024-09-13 ENCOUNTER — Other Ambulatory Visit (HOSPITAL_COMMUNITY): Payer: Self-pay

## 2024-09-13 DIAGNOSIS — F3340 Major depressive disorder, recurrent, in remission, unspecified: Secondary | ICD-10-CM

## 2024-09-13 MED ORDER — SERTRALINE HCL 50 MG PO TABS
50.0000 mg | ORAL_TABLET | Freq: Every day | ORAL | 0 refills | Status: AC
  Filled 2024-09-13: qty 30, 30d supply, fill #0

## 2024-09-14 ENCOUNTER — Other Ambulatory Visit (HOSPITAL_COMMUNITY): Payer: Self-pay

## 2024-09-27 ENCOUNTER — Other Ambulatory Visit (HOSPITAL_COMMUNITY): Payer: Self-pay

## 2024-10-08 ENCOUNTER — Other Ambulatory Visit (HOSPITAL_COMMUNITY): Payer: Self-pay

## 2024-10-11 ENCOUNTER — Other Ambulatory Visit (HOSPITAL_COMMUNITY): Payer: Self-pay

## 2024-10-11 ENCOUNTER — Other Ambulatory Visit: Payer: Self-pay

## 2024-10-11 MED ORDER — HYDROCHLOROTHIAZIDE 25 MG PO TABS
25.0000 mg | ORAL_TABLET | Freq: Every day | ORAL | 2 refills | Status: AC
  Filled 2024-10-11: qty 30, 30d supply, fill #0

## 2024-10-11 MED ORDER — LOSARTAN POTASSIUM 50 MG PO TABS
50.0000 mg | ORAL_TABLET | Freq: Every day | ORAL | 2 refills | Status: AC
  Filled 2024-10-11: qty 30, 30d supply, fill #0

## 2024-10-12 ENCOUNTER — Other Ambulatory Visit (HOSPITAL_BASED_OUTPATIENT_CLINIC_OR_DEPARTMENT_OTHER): Payer: Self-pay | Admitting: Family Medicine

## 2024-10-12 DIAGNOSIS — F3340 Major depressive disorder, recurrent, in remission, unspecified: Secondary | ICD-10-CM

## 2025-03-21 ENCOUNTER — Ambulatory Visit: Admitting: Family Medicine

## 2025-03-22 ENCOUNTER — Ambulatory Visit: Admitting: Family Medicine
